# Patient Record
Sex: Male | Born: 1975 | Race: Black or African American | Hispanic: No | Marital: Single | Smoking: Former smoker
Health system: Southern US, Community
[De-identification: ages and names within clinical notes are randomized; demographics above are authoritative.]

## PROBLEM LIST (undated history)

## (undated) DIAGNOSIS — K5190 Ulcerative colitis, unspecified, without complications: Secondary | ICD-10-CM

## (undated) DIAGNOSIS — F89 Unspecified disorder of psychological development: Secondary | ICD-10-CM

## (undated) DIAGNOSIS — I10 Essential (primary) hypertension: Secondary | ICD-10-CM

## (undated) DIAGNOSIS — R569 Unspecified convulsions: Secondary | ICD-10-CM

## (undated) HISTORY — DX: Ulcerative colitis, unspecified, without complications: K51.90

## (undated) HISTORY — PX: TONSILLECTOMY: SUR1361

---

## 2006-12-11 ENCOUNTER — Emergency Department: Payer: Self-pay | Admitting: Internal Medicine

## 2013-07-05 ENCOUNTER — Emergency Department: Payer: Self-pay | Admitting: Emergency Medicine

## 2013-07-05 LAB — BASIC METABOLIC PANEL
Anion Gap: 1 — ABNORMAL LOW (ref 7–16)
BUN: 10 mg/dL (ref 7–18)
CO2: 32 mmol/L (ref 21–32)
Calcium, Total: 9.4 mg/dL (ref 8.5–10.1)
Chloride: 100 mmol/L (ref 98–107)
Creatinine: 1.05 mg/dL (ref 0.60–1.30)
EGFR (African American): >60
EGFR (Non-African Amer.): >60
Glucose: 94 mg/dL (ref 65–99)
OSMOLALITY: 265 (ref 275–301)
Potassium: 3.8 mmol/L (ref 3.5–5.1)
Sodium: 133 mmol/L — ABNORMAL LOW (ref 136–145)

## 2013-07-05 LAB — CBC
HCT: 41.6 % (ref 40.0–52.0)
HGB: 13.2 g/dL (ref 13.0–18.0)
MCH: 23.2 pg — AB (ref 26.0–34.0)
MCHC: 31.6 g/dL — ABNORMAL LOW (ref 32.0–36.0)
MCV: 73 fL — AB (ref 80–100)
Platelet: 190 10*3/uL (ref 150–440)
RBC: 5.67 10*6/uL (ref 4.40–5.90)
RDW: 13.4 % (ref 11.5–14.5)
WBC: 7.9 10*3/uL (ref 3.8–10.6)

## 2013-07-09 DIAGNOSIS — R55 Syncope and collapse: Secondary | ICD-10-CM

## 2013-07-09 HISTORY — DX: Syncope and collapse: R55

## 2015-05-26 ENCOUNTER — Other Ambulatory Visit: Payer: Self-pay | Admitting: Family Medicine

## 2015-05-26 ENCOUNTER — Ambulatory Visit
Admission: RE | Admit: 2015-05-26 | Discharge: 2015-05-26 | Disposition: A | Discharge status: Home / Self Care | Payer: Medicare Other | Source: Ambulatory Visit | Attending: Family Medicine | Admitting: Family Medicine

## 2015-05-26 DIAGNOSIS — M25531 Pain in right wrist: Secondary | ICD-10-CM

## 2016-03-14 ENCOUNTER — Encounter: Payer: Self-pay | Admitting: Emergency Medicine

## 2016-03-14 ENCOUNTER — Emergency Department: Payer: Medicare Other

## 2016-03-14 ENCOUNTER — Emergency Department
Admission: EM | Admit: 2016-03-14 | Discharge: 2016-03-14 | Disposition: A | Discharge status: Home / Self Care | Payer: Medicare Other | Attending: Emergency Medicine | Admitting: Emergency Medicine

## 2016-03-14 DIAGNOSIS — Y999 Unspecified external cause status: Secondary | ICD-10-CM | POA: Insufficient documentation

## 2016-03-14 DIAGNOSIS — I10 Essential (primary) hypertension: Secondary | ICD-10-CM | POA: Insufficient documentation

## 2016-03-14 DIAGNOSIS — Z79899 Other long term (current) drug therapy: Secondary | ICD-10-CM | POA: Insufficient documentation

## 2016-03-14 DIAGNOSIS — S39012A Strain of muscle, fascia and tendon of lower back, initial encounter: Secondary | ICD-10-CM | POA: Diagnosis not present

## 2016-03-14 DIAGNOSIS — Y929 Unspecified place or not applicable: Secondary | ICD-10-CM | POA: Diagnosis not present

## 2016-03-14 DIAGNOSIS — S3992XA Unspecified injury of lower back, initial encounter: Secondary | ICD-10-CM | POA: Diagnosis present

## 2016-03-14 DIAGNOSIS — X58XXXA Exposure to other specified factors, initial encounter: Secondary | ICD-10-CM | POA: Insufficient documentation

## 2016-03-14 DIAGNOSIS — Y939 Activity, unspecified: Secondary | ICD-10-CM | POA: Insufficient documentation

## 2016-03-14 HISTORY — DX: Essential (primary) hypertension: I10

## 2016-03-14 MED ORDER — NAPROXEN 500 MG PO TABS: 500.0000 mg | ORAL_TABLET | Freq: Two times a day (BID) | ORAL | Status: DC

## 2016-03-14 NOTE — ED Provider Notes (Signed)
[facility] Emergency Department Provider Note   ____________________________________________   First MD Initiated Contact with Patient 03/14/16 [phone]     (approximate)  I have reviewed the triage vital signs and the nursing notes.   HISTORY  Chief Complaint Back Pain    HPI Cameron Payne is a 40 y.o. male patient complained of low back pain for 2-3 weeks. Patient denies any provocative incident for his pain. Patient has mild mental retardation and his mother is helping with the history. Patient denies any radicular component to this pain. Patient denies any bladder or bowel dysfunction. Mother states she is also concerned about the patient's loss of weight. Mother has discussed this complaint with family doctor and stated all the blood work was normal. Mother stated there is decreased appetite. Mother and patient denies vomiting or diarrhea.Patient rates his pain as a 5/10. Patient describes pain as "achy". No palliative measures for this complaint.   Past Medical History:  Diagnosis Date  . Hypertension     There are no active problems to display for this patient.   History reviewed. No pertinent surgical history.  Prior to Admission medications   Medication Sig Start Date End Date Taking? Authorizing Provider  hydrochlorothiazide (MICROZIDE) 12.5 MG capsule Take 12.5 mg by mouth daily.   Yes Historical Provider, MD  naproxen (NAPROSYN) 500 MG tablet Take 1 tablet (500 mg total) by mouth 2 (two) times daily with a meal. 03/14/16   Sable Feil, PA-C    Allergies Review of patient's allergies indicates no known allergies.  No family history on file.  Social History Social History  Substance Use Topics  . Smoking status: Never Smoker  . Smokeless tobacco: Never Used  . Alcohol use No    Review of Systems Constitutional: No fever/chills Eyes: No visual changes. ENT: No sore throat. Cardiovascular: Denies chest pain. Respiratory:  Denies shortness of breath. Gastrointestinal: No abdominal pain.  No nausea, no vomiting.  No diarrhea.  No constipation. Genitourinary: Negative for dysuria. Musculoskeletal: Positive for back pain. Skin: Negative for rash. Neurological: Negative for headaches, focal weakness or numbness. Endocrine:Hypertension Hematological/Lymphatic: Allergic/Immunilogical: **} 10-point ROS otherwise negative.  ____________________________________________   PHYSICAL EXAM:  VITAL SIGNS: ED Triage Vitals [03/14/16 0923]  Enc Vitals Group     BP 129/75     Pulse Rate 73     Resp      Temp 98 F (36.7 C)     Temp Source Oral     SpO2 97 %     Weight      Height      Head Circumference      Peak Flow      Pain Score 5     Pain Loc      Pain Edu?      Excl. in [place]?     Constitutional: Alert and oriented. Well appearing and in no acute distress. Eyes: Conjunctivae are normal. PERRL. EOMI. Head: Atraumatic. Nose: No congestion/rhinnorhea. Mouth/Throat: Mucous membranes are moist.  Oropharynx non-erythematous. Neck: No stridor.  No cervical spine tenderness to palpation. Hematological/Lymphatic/Immunilogical: No cervical lymphadenopathy. Cardiovascular: Normal rate, regular rhythm. Grossly normal heart sounds.  Good peripheral circulation. Respiratory: Normal respiratory effort.  No retractions. Lungs CTAB. Gastrointestinal: Soft and nontender. No distention. No abdominal bruits. No CVA tenderness. Musculoskeletal: No lower extremity tenderness nor edema.  No joint effusions.No obvious deformity of the lumbar spine. Patient has some mild guarding palpation of L3-S1. Patient negative straight leg test. Patient has  negative gait. Patient decreased range of motion with flexion.  Neurologic:  Normal speech and language. No gross focal neurologic deficits are appreciated. No gait instability. Skin:  Skin is warm, dry and intact. No rash noted. Psychiatric: Mood and affect are normal. Speech and  behavior are normal.  ____________________________________________   LABS (all labs ordered are listed, but only abnormal results are displayed)  Labs Reviewed - No data to display ____________________________________________  EKG   ____________________________________________  RADIOLOGY  No acute findings x-ray of the lumbar spine. ____________________________________________   PROCEDURES  Procedure(s) performed: None  Procedures  Critical Care performed: No  ____________________________________________   INITIAL IMPRESSION / ASSESSMENT AND PLAN / ED COURSE  Pertinent labs & imaging results that were available during my care of the patient were reviewed by me and considered in my medical decision making (see chart for details).  Low back pain. Discussed x-ray finding with patient. Patient given discharge Instructions. Patient in a prescription for naproxen and advised follow-up with family doctor condition persists.  Clinical Course     ____________________________________________   FINAL CLINICAL IMPRESSION(S) / ED DIAGNOSES  Final diagnoses:  Strain of lumbar region, initial encounter      NEW MEDICATIONS STARTED DURING THIS VISIT:  New Prescriptions   NAPROXEN (NAPROSYN) 500 MG TABLET    Take 1 tablet (500 mg total) by mouth 2 (two) times daily with a meal.     Note:  This document was prepared using Dragon voice recognition software and may include unintentional dictation errors.    Sable Feil, PA-C 03/14/16 [place], MD 03/14/16 [phone]

## 2016-03-14 NOTE — ED Triage Notes (Signed)
Patient presents to the ED with lower back pain.  Patient has some mental delays and family reports being worried patient has more than just back pain but is only able to verbalize back pain.  Patient ambulatory to triage with no distress at this time.  Family denies nausea and vomiting.

## 2016-03-14 NOTE — ED Notes (Signed)
See triage note   Having lower back pain which is non radiating for couple of weeks   Unsure of injury denies any n/v/ fever just having back pain  Ambulates well to treatment area

## 2017-01-16 ENCOUNTER — Emergency Department: Payer: Medicare Other

## 2017-01-16 ENCOUNTER — Encounter: Payer: Self-pay | Admitting: Emergency Medicine

## 2017-01-16 ENCOUNTER — Emergency Department
Admission: EM | Admit: 2017-01-16 | Discharge: 2017-01-16 | Disposition: A | Discharge status: Home / Self Care | Payer: Medicare Other | Attending: Emergency Medicine | Admitting: Emergency Medicine

## 2017-01-16 DIAGNOSIS — R55 Syncope and collapse: Secondary | ICD-10-CM | POA: Diagnosis present

## 2017-01-16 DIAGNOSIS — I10 Essential (primary) hypertension: Secondary | ICD-10-CM | POA: Insufficient documentation

## 2017-01-16 HISTORY — DX: Unspecified convulsions: R56.9

## 2017-01-16 LAB — COMPREHENSIVE METABOLIC PANEL
ALT: 13 U/L — ABNORMAL LOW (ref 17–63)
ANION GAP: 8 (ref 5–15)
AST: 24 U/L (ref 15–41)
Albumin: 4.4 g/dL (ref 3.5–5.0)
Alkaline Phosphatase: 41 U/L (ref 38–126)
BILIRUBIN TOTAL: 0.5 mg/dL (ref 0.3–1.2)
BUN: 14 mg/dL (ref 6–20)
CO2: 29 mmol/L (ref 22–32)
Calcium: 9.6 mg/dL (ref 8.9–10.3)
Chloride: 103 mmol/L (ref 101–111)
Creatinine, Ser: 0.92 mg/dL (ref 0.61–1.24)
GFR calc Af Amer: >60 mL/min (ref >60)
Glucose, Bld: 97 mg/dL (ref 65–99)
POTASSIUM: 3.6 mmol/L (ref 3.5–5.1)
Sodium: 140 mmol/L (ref 135–145)
TOTAL PROTEIN: 7.7 g/dL (ref 6.5–8.1)

## 2017-01-16 LAB — CBC
HCT: 41 % (ref 40.0–52.0)
Hemoglobin: 13.2 g/dL (ref 13.0–18.0)
MCH: 22.8 pg — ABNORMAL LOW (ref 26.0–34.0)
MCHC: 32.1 g/dL (ref 32.0–36.0)
MCV: 71.1 fL — AB (ref 80.0–100.0)
PLATELETS: 197 10*3/uL (ref 150–440)
RBC: 5.76 MIL/uL (ref 4.40–5.90)
RDW: 13.6 % (ref 11.5–14.5)
WBC: 6.8 10*3/uL (ref 3.8–10.6)

## 2017-01-16 LAB — URINALYSIS, COMPLETE (UACMP) WITH MICROSCOPIC
BILIRUBIN URINE: NEGATIVE
Bacteria, UA: NONE SEEN
GLUCOSE, UA: NEGATIVE mg/dL
Hgb urine dipstick: NEGATIVE
KETONES UR: NEGATIVE mg/dL
LEUKOCYTES UA: NEGATIVE
Nitrite: NEGATIVE
PH: 7 (ref 5.0–8.0)
Protein, ur: NEGATIVE mg/dL
RBC / HPF: NONE SEEN RBC/hpf (ref 0–5)
Specific Gravity, Urine: 1.01 (ref 1.005–1.030)

## 2017-01-16 LAB — TROPONIN I

## 2017-01-16 MED ORDER — SODIUM CHLORIDE 0.9 % IV SOLN
Freq: Once | INTRAVENOUS | Status: AC
  Administered 2017-01-16: 11:00:00 via INTRAVENOUS

## 2017-01-16 NOTE — ED Provider Notes (Signed)
[facility] Emergency Department Provider Note       Time seen: ----------------------------------------- 10:30 AM on 01/16/2017 -----------------------------------------     I have reviewed the triage vital signs and the nursing notes.   HISTORY   Chief Complaint Loss of Consciousness    HPI Cameron Payne is a 41 y.o. male who presents to the ED for syncope. Patient reports he passed out twice yesterday. The second one was witnessed by the sister states he had a brief period of confusion for about 5 minutes afterwards. He arrives alert and oriented. 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 Patient states when he passed out today he was standing up urinating. He subsequently woke up on the bathroom floor. He denies any complaints at this time.   Past Medical History:  Diagnosis Date  . Hypertension   . Seizures ([place])    as a child    There are no active problems to display for this patient.   History reviewed. No pertinent surgical history.  Allergies Patient has no known allergies.  Social History Social History  Substance Use Topics  . Smoking status: Never Smoker  . Smokeless tobacco: Never Used  . Alcohol use No    Review of Systems Constitutional: Negative for fever. Eyes: Negative for vision changes ENT:  Negative for congestion, sore throat Cardiovascular: Negative for chest pain. Respiratory: Negative for shortness of breath. Gastrointestinal: Negative for abdominal pain, vomiting and diarrhea. Genitourinary: Negative for dysuria. Musculoskeletal: Negative for back pain. Skin: Negative for rash. Neurological: Negative for headaches, focal weakness or numbness.  All systems negative/normal/unremarkable except as stated in the HPI  ____________________________________________   PHYSICAL EXAM:  VITAL SIGNS: ED Triage Vitals [01/16/17 0859]  Enc Vitals Group     BP 128/85     Pulse  Rate 83     Resp 20     Temp 99.7 F (37.6 C)     Temp Source Oral     SpO2 100 %     Weight 146 lb 2.6 oz (66.3 kg)     Height 5' 11"  (1.803 m)     Head Circumference      Peak Flow      Pain Score      Pain Loc      Pain Edu?      Excl. in [place]?     Constitutional: Alert and oriented. Well appearing and in no distress. Eyes: Conjunctivae are normal. Normal extraocular movements. ENT   Head: Normocephalic and atraumatic.   Nose: No congestion/rhinnorhea.   Mouth/Throat: Mucous membranes are moist.   Neck: No stridor. Cardiovascular: Normal rate, regular rhythm. No murmurs, rubs, or gallops. Respiratory: Normal respiratory effort without tachypnea nor retractions. Breath sounds are clear and equal bilaterally. No wheezes/rales/rhonchi. Gastrointestinal: Soft and nontender. Normal bowel sounds Musculoskeletal: Nontender with normal range of motion in extremities. No lower extremity tenderness nor edema. Neurologic:  Normal speech and language. No gross focal neurologic deficits are appreciated.  Skin:  Skin is warm, dry and intact. No rash noted. Psychiatric: Mood and affect are normal. Speech and behavior are normal.  ____________________________________________  EKG: Interpreted by me. Sinus rhythm rate of 77 bpm, normal PR interval, normal QRS width, normal QT, normal axis.  ____________________________________________  ED COURSE:  Pertinent labs & imaging results that were available during my care of the patient were reviewed by me and considered in my medical decision making (see chart  for details). Patient presents for syncope, we will assess with labs and imaging as indicated.   Procedures ____________________________________________   LABS (pertinent positives/negatives)  Labs Reviewed  CBC - Abnormal; Notable for the following:       Result Value   MCV 71.1 (*)    MCH 22.8 (*)    All other components within normal limits  URINALYSIS, COMPLETE  (UACMP) WITH MICROSCOPIC - Abnormal; Notable for the following:    Color, Urine YELLOW (*)    APPearance CLEAR (*)    Squamous Epithelial / LPF 0-5 (*)    All other components within normal limits  COMPREHENSIVE METABOLIC PANEL - Abnormal; Notable for the following:    ALT 13 (*)    All other components within normal limits  TROPONIN I    RADIOLOGY Images were viewed by me  CT head  IMPRESSION: 1. 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 2. No acute intracranial findings or skull fracture. ____________________________________________  FINAL ASSESSMENT AND PLAN  Syncope  Plan: Patient's labs and imaging were dictated above. Patient had presented for Syncope of uncertain etiology. We will advise stopping his blood pressure medication and following up with cardiology for reevaluation. He is stable at this time.   Earleen Newport, MD   Note: This note was generated in part or whole with voice recognition software. Voice recognition is usually quite accurate but there are transcription errors that can and very often do occur. I apologize for any typographical errors that were not detected and corrected.     Earleen Newport, MD 01/16/17 [phone]

## 2017-01-16 NOTE — ED Triage Notes (Signed)
FIRST NURSE NOTE -pt c/o syncope X 2 today. Ambulatory to check in desk without difficulty. NAD noted at this time

## 2017-01-16 NOTE — ED Triage Notes (Signed)
States "passed out" x 2 yesterday. Second witnessed by sister who states he had period of confusion for about 5 min after. Alert and oriented now. History of seizures as child but not since 41 year old.

## 2017-10-23 IMAGING — CT CT HEAD W/O CM
3 series · 14 of 46 positions shown, 16 images · non-contrast
Comparison: None.

CLINICAL DATA: Syncopal episodes yesterday.

EXAM:
CT HEAD WITHOUT CONTRAST
TECHNIQUE: Contiguous axial images were obtained from the base of the skull
through the vertex without intravenous contrast.

[Series 2: head wo · axial · 0.47mm/px · z∈[-145,-25]mm · 8 of 29 slices shown, 10 images]
[im 3/29  brain]
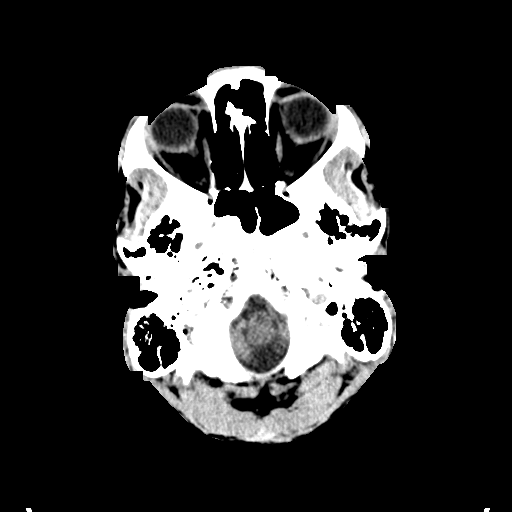
[im 3/29  bone]
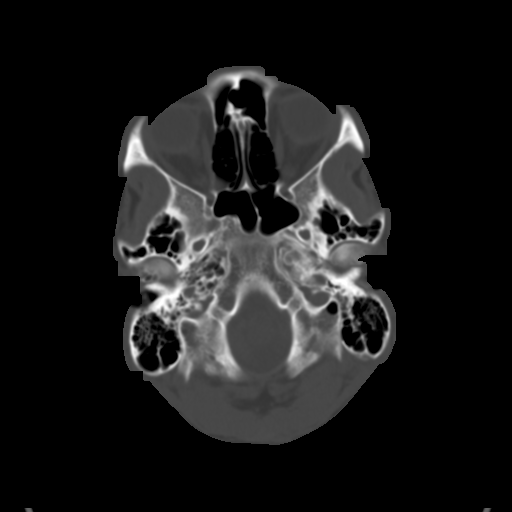
[im 7/29  brain]
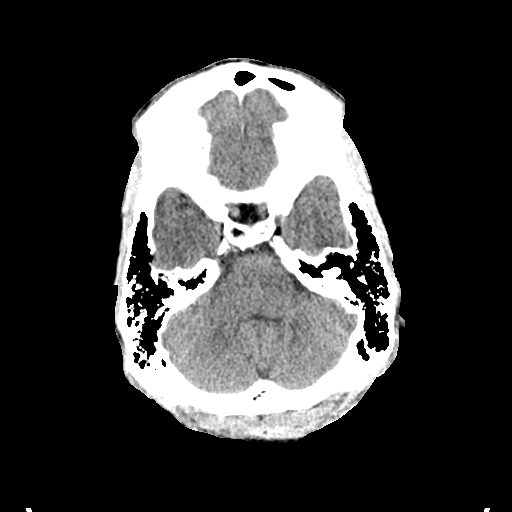
[im 10/29  brain]
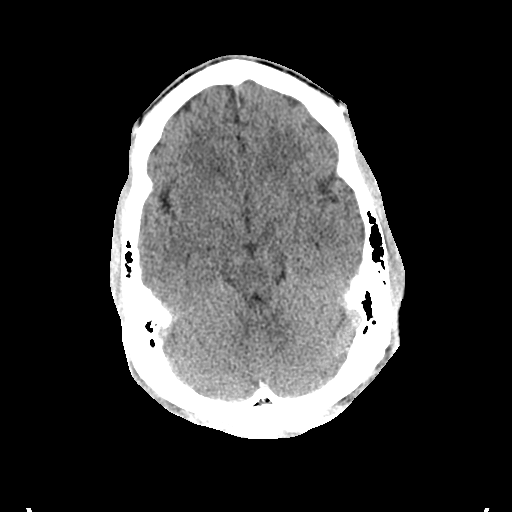
[im 13/29  brain]
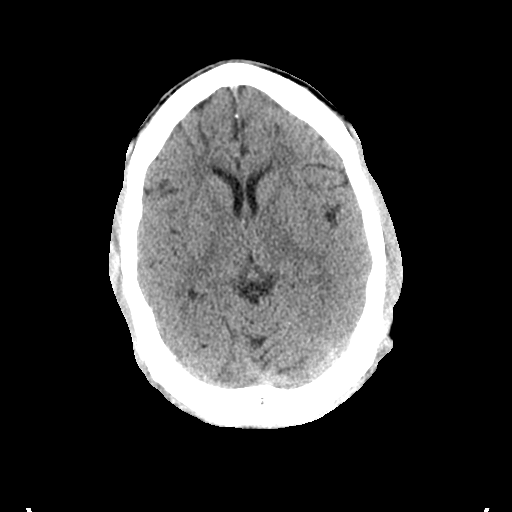
[im 17/29  brain]
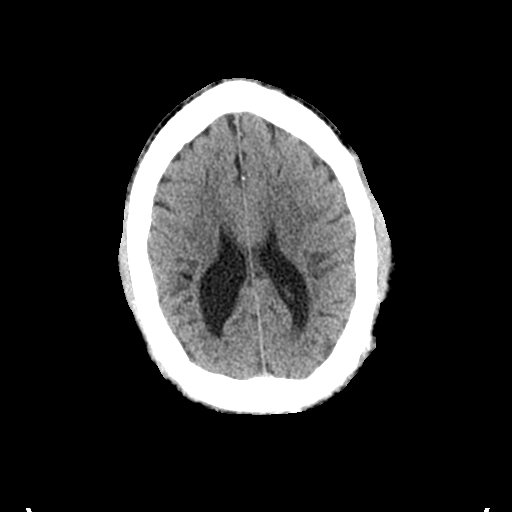
[im 17/29  bone]
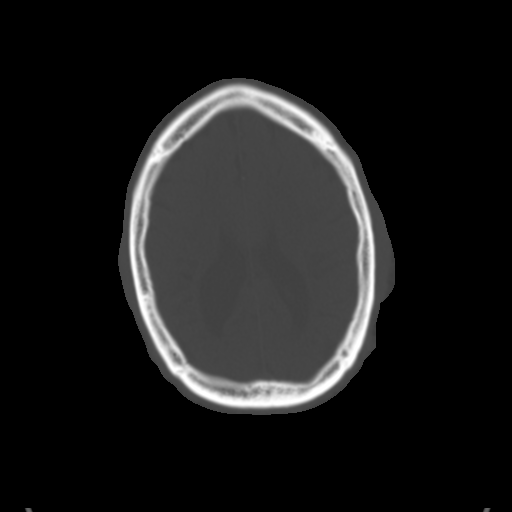
[im 20/29  brain]
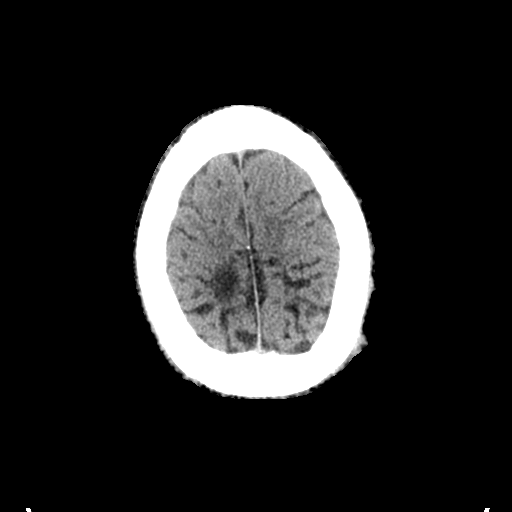
[im 23/29  brain]
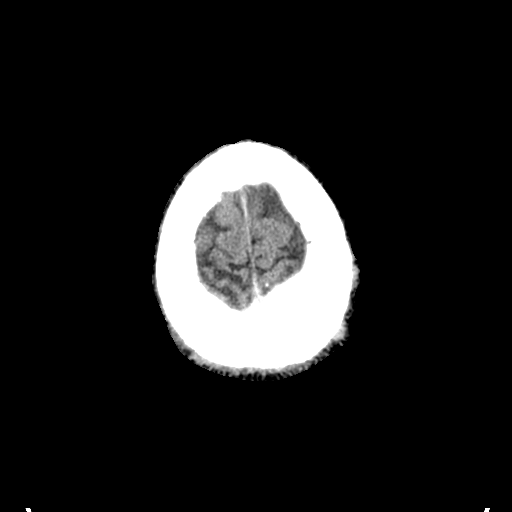
[im 27/29  brain]
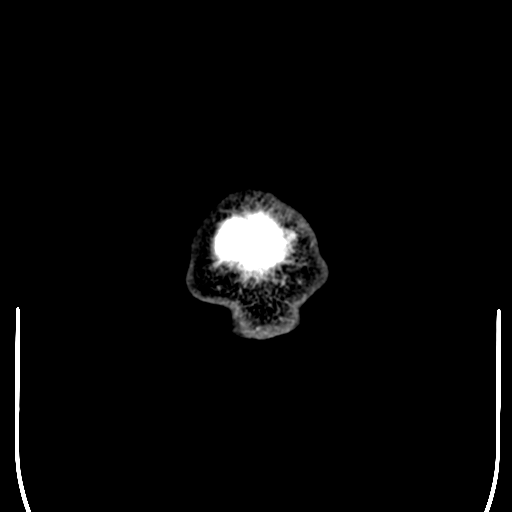

[Series 4: coronal soft tissue · coronal · 0.28mm/px · 3 of 58 slices shown]
[im 20/58  brain]
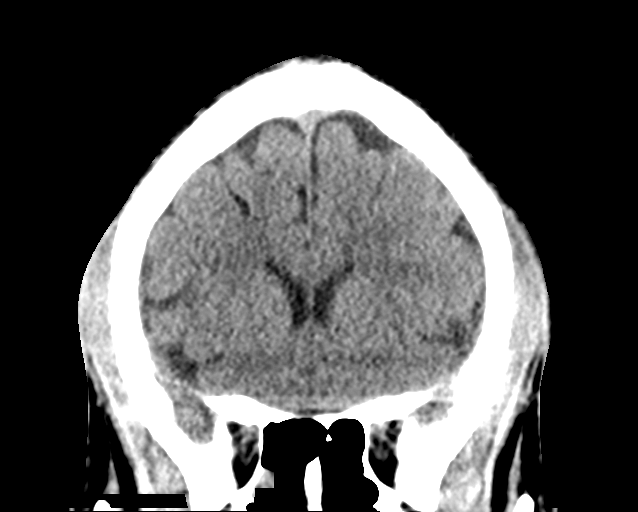
[im 26/58  brain]
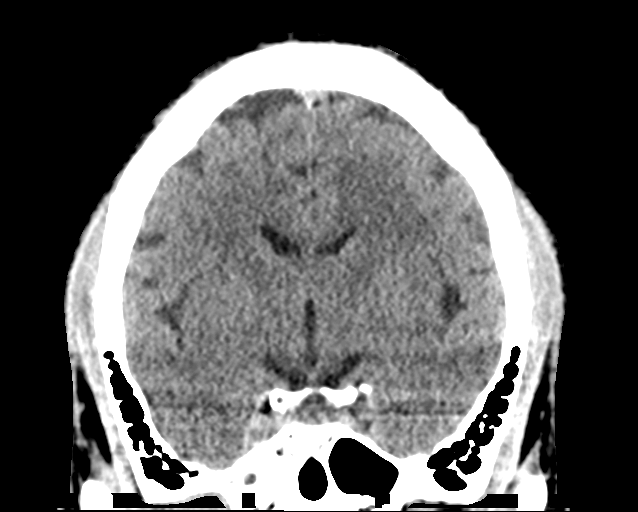
[im 32/58  brain]
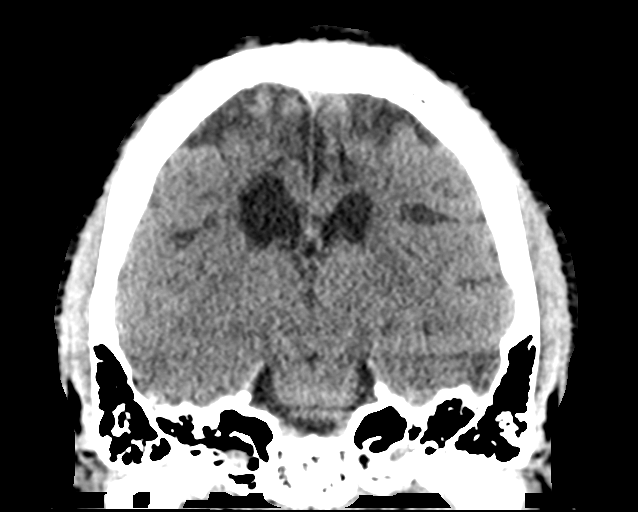

[Series 5: sagittal soft tissue · sagittal · 0.29mm/px · 3 of 51 slices shown]
[im 17/51  brain]
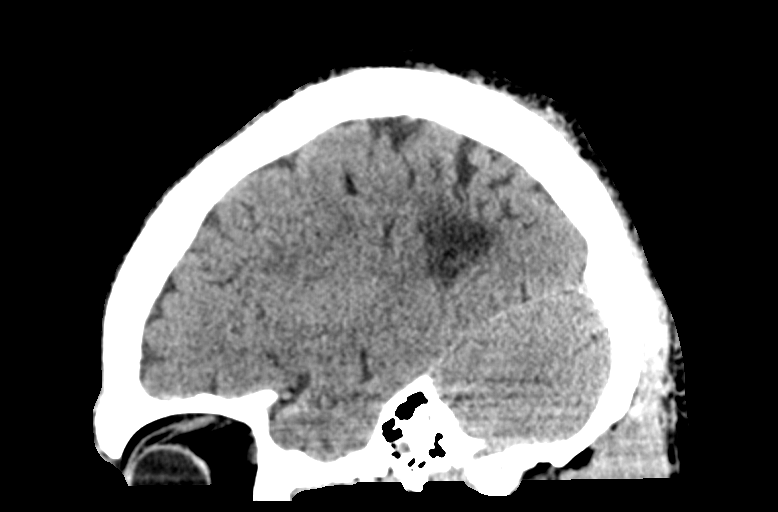
[im 26/51  brain]
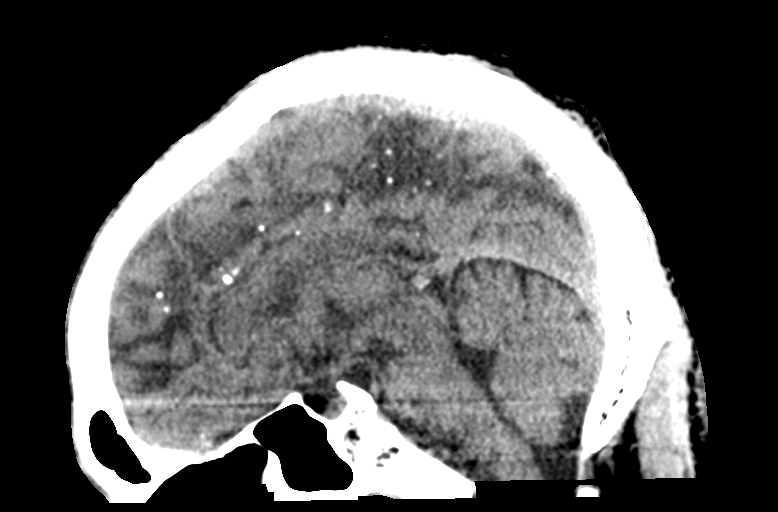
[im 34/51  brain]
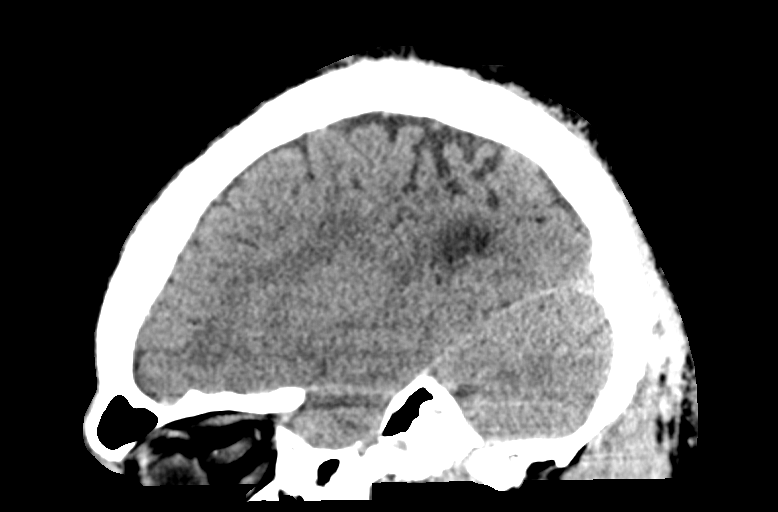

[14 of 46 positions shown; findings below may reference images not displayed]

FINDINGS: Brain: Dilatation of the lateral ventricles, right greater than left
at the parieto-occipital junction region there is loss of white
matter and cortical atrophy. Tiny calcifications are also noted.
This is most likely birth related or perinatal injury or infection.

No acute intracranial findings. No extra-axial fluid collections. No
findings for infarction or hemorrhage. The brainstem and cerebellum
are grossly normal.

Vascular: No hyperdense vessel or unexpected calcification.

Skull: No skull fracture or bone lesions.

Sinuses/Orbits: The paranasal sinuses and mastoid air cells are
clear except for a small amount of fluid or debris in a right
posterior ethmoid air cell. Prominently aerated mastoid air cells,
temporal bones and sphenoid wings. The globes are intact.

Other: No scalp lesions or hematoma.
IMPRESSION: 1. Evidence of remote (likely perinatal) brain injury.
2. No acute intracranial findings or skull fracture.

## 2017-10-24 ENCOUNTER — Emergency Department
Admission: EM | Admit: 2017-10-24 | Discharge: 2017-10-24 | Disposition: A | Discharge status: Home / Self Care | Payer: Medicare Other | Attending: Emergency Medicine | Admitting: Emergency Medicine

## 2017-10-24 ENCOUNTER — Encounter: Payer: Self-pay | Admitting: Emergency Medicine

## 2017-10-24 DIAGNOSIS — F79 Unspecified intellectual disabilities: Secondary | ICD-10-CM | POA: Diagnosis not present

## 2017-10-24 DIAGNOSIS — I10 Essential (primary) hypertension: Secondary | ICD-10-CM | POA: Diagnosis not present

## 2017-10-24 DIAGNOSIS — R197 Diarrhea, unspecified: Secondary | ICD-10-CM

## 2017-10-24 DIAGNOSIS — K921 Melena: Secondary | ICD-10-CM | POA: Diagnosis not present

## 2017-10-24 DIAGNOSIS — Z79899 Other long term (current) drug therapy: Secondary | ICD-10-CM | POA: Diagnosis not present

## 2017-10-24 HISTORY — DX: Unspecified disorder of psychological development: F89

## 2017-10-24 LAB — LIPASE, BLOOD: Lipase: 43 U/L (ref 11–51)

## 2017-10-24 LAB — URINALYSIS, COMPLETE (UACMP) WITH MICROSCOPIC
BACTERIA UA: NONE SEEN
Bilirubin Urine: NEGATIVE
GLUCOSE, UA: NEGATIVE mg/dL
HGB URINE DIPSTICK: NEGATIVE
Ketones, ur: NEGATIVE mg/dL
Leukocytes, UA: NEGATIVE
NITRITE: NEGATIVE
Protein, ur: NEGATIVE mg/dL
SPECIFIC GRAVITY, URINE: 1.025 (ref 1.005–1.030)
Squamous Epithelial / LPF: NONE SEEN (ref 0–5)
pH: 6 (ref 5.0–8.0)

## 2017-10-24 LAB — COMPREHENSIVE METABOLIC PANEL
ALK PHOS: 47 U/L (ref 38–126)
ALT: 8 U/L — ABNORMAL LOW (ref 17–63)
ANION GAP: 7 (ref 5–15)
AST: 18 U/L (ref 15–41)
Albumin: 4.3 g/dL (ref 3.5–5.0)
BILIRUBIN TOTAL: 0.5 mg/dL (ref 0.3–1.2)
BUN: 17 mg/dL (ref 6–20)
CALCIUM: 9.4 mg/dL (ref 8.9–10.3)
CO2: 28 mmol/L (ref 22–32)
Chloride: 109 mmol/L (ref 101–111)
Creatinine, Ser: 0.77 mg/dL (ref 0.61–1.24)
GFR calc Af Amer: >60 mL/min (ref >60)
GLUCOSE: 70 mg/dL (ref 65–99)
Potassium: 4 mmol/L (ref 3.5–5.1)
Sodium: 144 mmol/L (ref 135–145)
TOTAL PROTEIN: 7.5 g/dL (ref 6.5–8.1)

## 2017-10-24 LAB — CBC
HCT: 38.4 % — ABNORMAL LOW (ref 40.0–52.0)
HEMOGLOBIN: 12.2 g/dL — AB (ref 13.0–18.0)
MCH: 23.2 pg — ABNORMAL LOW (ref 26.0–34.0)
MCHC: 31.8 g/dL — AB (ref 32.0–36.0)
MCV: 72.8 fL — ABNORMAL LOW (ref 80.0–100.0)
Platelets: 214 10*3/uL (ref 150–440)
RBC: 5.28 MIL/uL (ref 4.40–5.90)
RDW: 14 % (ref 11.5–14.5)
WBC: 6 10*3/uL (ref 3.8–10.6)

## 2017-10-24 NOTE — Discharge Instructions (Addendum)
Your workup in the Emergency Department today was reassuring.  We did not find any specific abnormalities.  We recommend you drink plenty of fluids, take your regular medications and/or any new ones prescribed today, and follow up with the doctor(s) listed in these documents as recommended.  Return to the Emergency Department if you develop new or worsening symptoms that concern you.  

## 2017-10-24 NOTE — ED Triage Notes (Signed)
Pt arrived via POV with mother, reports diarrhea for about 1 week, bright red blood present when wiping. Pt denies any black stools.  Pt denies abdominal pain, pt states he has not seen blood in toilet.  Mother states he has told her there was blood only a few times.

## 2017-10-24 NOTE — ED Notes (Signed)
Pt unable to provide urine specimen at this time

## 2017-10-24 NOTE — ED Provider Notes (Signed)
[facility] Emergency Department Provider Note  ____________________________________________   First MD Initiated Contact with Patient 10/24/17 1738     (approximate)  I have reviewed the triage vital signs and the nursing notes.   HISTORY  Chief Complaint Diarrhea  The patient has a developmental disability and his mother is his legal guardian and presents with him at bedside and provides some of the history.  HPI Cameron Payne is a 42 y.o. male who presents for evaluation of bright red blood per rectum and some possible diarrhea.  The patient is unable to provide an extensive history but reports that he has had some soft stools for about a week and that when he wipes there is bright red blood on the toilet paper.  He has not had any black or tarry stools.  He denies nausea, vomiting, abdominal pain or cramping, fever/chills, chest pain, and shortness of breath.  Nothing particular makes his symptoms better or worse.  He is only observe the blood a few times but his mother felt like he should be evaluated.  He is not able to provide any additional history but is in no acute distress at this time.  States that the symptoms are mild.  Past Medical History:  Diagnosis Date  . Developmental disability   . Hypertension   . Seizures ([place])    as a child    There are no active problems to display for this patient.   History reviewed. No pertinent surgical history.  Prior to Admission medications   Medication Sig Start Date End Date Taking? Authorizing Provider  fexofenadine (ALLEGRA) 180 MG tablet Take 180 mg by mouth daily.    [provider]  hydrochlorothiazide (MICROZIDE) 12.5 MG capsule Take 12.5 mg by mouth daily.    [provider]  naproxen (NAPROSYN) 500 MG tablet Take 1 tablet (500 mg total) by mouth 2 (two) times daily with a meal. Patient not taking: Reported on 01/16/2017 03/14/16   Sable Feil, PA-C     Allergies Patient has no known allergies.  History reviewed. No pertinent family history.  Social History Social History   Tobacco Use  . Smoking status: Never Smoker  . Smokeless tobacco: Never Used  Substance Use Topics  . Alcohol use: No  . Drug use: Not on file    Review of Systems The patient has a developmental disability and his mother is his legal guardian and presents with him at bedside and provides some of the history.  Constitutional: No fever/chills Eyes: No visual changes. ENT: No sore throat. Cardiovascular: Denies chest pain. Respiratory: Denies shortness of breath. Gastrointestinal: No abdominal pain.  No nausea, no vomiting.  Increased frequency of bowel movements.  Painless bright red blood per rectum.  Genitourinary: Negative for dysuria. Musculoskeletal: Negative for neck pain.  Negative for back pain. Integumentary: Negative for rash. Neurological: Negative for headaches, focal weakness or numbness.   ____________________________________________   PHYSICAL EXAM:  VITAL SIGNS: ED Triage Vitals  Enc Vitals Group     BP 10/24/17 1434 129/84     Pulse Rate 10/24/17 1434 86     Resp 10/24/17 1434 18     Temp 10/24/17 1434 98 F (36.7 C)     Temp Source 10/24/17 1434 Oral     SpO2 10/24/17 1434 99 %     Weight --      Height --      Head Circumference --      Peak Flow --  Pain Score 10/24/17 1435 0     Pain Loc --      Pain Edu? --      Excl. in [place]? --     Constitutional: Alert and oriented. Well appearing and in no acute distress. Eyes: Conjunctivae are normal.  Head: Atraumatic. Nose: No congestion/rhinnorhea. Mouth/Throat: Mucous membranes are moist. Neck: No stridor.  No meningeal signs.   Cardiovascular: Normal rate, regular rhythm. Good peripheral circulation. Grossly normal heart sounds. Respiratory: Normal respiratory effort.  No retractions. Lungs CTAB. Gastrointestinal: Soft and nontender. No distention.  Rectal:  Nontender, no external abnormalities including hemorrhoids.  No significant amount of stool in the rectal vault, but there is a minimal amount of hematochezia, strongly positive on guaiac card Musculoskeletal: No lower extremity tenderness nor edema. No gross deformities of extremities. Neurologic:  Normal speech and language. No gross focal neurologic deficits are appreciated.  Skin:  Skin is warm, dry and intact. No rash noted. Psychiatric: Mood and affect are normal. Speech and behavior are normal.  ____________________________________________   LABS (all labs ordered are listed, but only abnormal results are displayed)  Labs Reviewed  COMPREHENSIVE METABOLIC PANEL - Abnormal; Notable for the following components:      Result Value   ALT 8 (*)    All other components within normal limits  CBC - Abnormal; Notable for the following components:   Hemoglobin 12.2 (*)    HCT 38.4 (*)    MCV 72.8 (*)    MCH 23.2 (*)    MCHC 31.8 (*)    All other components within normal limits  URINALYSIS, COMPLETE (UACMP) WITH MICROSCOPIC - Abnormal; Notable for the following components:   Color, Urine YELLOW (*)    APPearance CLEAR (*)    All other components within normal limits  LIPASE, BLOOD   ____________________________________________  EKG  None - EKG not ordered by ED physician ____________________________________________  RADIOLOGY   ED MD interpretation: No indication for imaging  Official radiology report(s): No results found.  ____________________________________________   PROCEDURES  Critical Care performed: No   Procedure(s) performed:   Procedures   ____________________________________________   INITIAL IMPRESSION / ASSESSMENT AND PLAN / ED COURSE  As part of my medical decision making, I reviewed the following data within the [place] History obtained from family, Nursing notes reviewed and incorporated, Labs reviewed  and Notes from prior  ED visits    Differential diagnosis includes, but is not limited to, internal hemorrhoid, diverticular disease, AV malformation, upper GI bleeding.  However the patient does have some bright red blood and he is in [place] no distress with no tenderness to palpation of the abdomen and no other symptoms.  Labs are all unremarkable including a stable hemoglobin.  Vital signs are normal and stable.  I advised him to follow-up with his primary care physician but I am also providing information so that he can follow-up with Dr. Vicente Males with gastroenterology and I sent a message through [facility] to Dr. Georgeann [facility].  His mom understands and agrees with the plan.  I gave my usual and customary return precautions.      ____________________________________________  FINAL CLINICAL IMPRESSION(S) / ED DIAGNOSES  Final diagnoses:  Hematochezia  Diarrhea, unspecified type     MEDICATIONS GIVEN DURING THIS VISIT:  Medications - No data to display   ED Discharge Orders    None       Note:  This document was prepared using Dragon voice recognition software and may include  unintentional dictation errors.    Hinda Kehr, MD 10/24/17 [phone]

## 2017-11-01 ENCOUNTER — Other Ambulatory Visit: Payer: Self-pay

## 2017-11-01 ENCOUNTER — Encounter: Payer: Self-pay | Admitting: Gastroenterology

## 2017-11-01 ENCOUNTER — Ambulatory Visit (INDEPENDENT_AMBULATORY_CARE_PROVIDER_SITE_OTHER): Payer: Medicare Other | Admitting: Gastroenterology

## 2017-11-01 VITALS — BP 124/76 | HR 73 | Temp 97.9°F | Ht 71.0 in | Wt 138.8 lb

## 2017-11-01 DIAGNOSIS — K625 Hemorrhage of anus and rectum: Secondary | ICD-10-CM

## 2017-11-01 DIAGNOSIS — D509 Iron deficiency anemia, unspecified: Secondary | ICD-10-CM | POA: Diagnosis not present

## 2017-11-01 MED ORDER — PEG 3350-KCL-NA BICARB-NACL 420 G PO SOLR: 4000.0000 mL | Freq: Once | ORAL | 0 refills | Status: AC

## 2017-11-01 MED ORDER — BISACODYL 5 MG PO TBEC: 10.0000 mg | DELAYED_RELEASE_TABLET | Freq: Once | ORAL | 0 refills | Status: AC

## 2017-11-01 NOTE — Progress Notes (Signed)
Cameron Payne, [place] [address]  Main: [phone]  Fax: [phone]   Gastroenterology Consultation  Referring Provider:     Donnie Coffin, MD Primary Care Physician:  Cameron Barman, MD Primary Gastroenterologist:  Dr. Vonda Payne Reason for Consultation:     Bright red blood per rectum        HPI:    Chief Complaint  Patient presents with  . Establish Care    Dr. Clide Payne ref. for hematochezia, diarrhea. blood noted when wipes x1 wk, also was having diarrhea at the time. no hemorroids.    Cameron Payne is a 42 y.o. y/o male referred for consultation & management  by Dr. Loma Payne, Cameron Loh, MD.  Patient went to the ER on Oct 24, 2017 due to bright red blood per rectum.  History is provided by patient and his mother at bedside.  Symptoms were ongoing for 1 to 1-1/2 weeks.  Patient reported 2-3 loose stools daily, with blood streaks in stool during that time.  Denies any abdominal pain, nausea vomiting, loss of appetite during that time.  Symptoms have now resolved.  Patient now reports formed bowel movements as of the last 2 to 3 days.  No fever or chills.  No recent travel.  No previous history of loose stools chronically.  No previous colonoscopy or EGD.  Lab work on the ER visit showed hemoglobin of 12.2, MCV of 72.  Patient was hemodynamically stable and discharged from the ER.  Past Medical History:  Diagnosis Date  . Developmental disability   . Hypertension   . Seizures ([place])    as a child    History reviewed. No pertinent surgical history.  Prior to Admission medications   Medication Sig Start Date End Date Taking? Authorizing Provider  fexofenadine (ALLEGRA) 180 MG tablet Take 180 mg by mouth daily.    [provider]  hydrochlorothiazide (MICROZIDE) 12.5 MG capsule Take 12.5 mg by mouth daily.    [provider]  naproxen (NAPROSYN) 500 MG tablet Take 1 tablet (500 mg total) by mouth 2 (two)  times daily with a meal. Patient not taking: Reported on 01/16/2017 03/14/16   Sable Feil, PA-C    Family History  Problem Relation Age of Onset  . Diabetes Mother   . Heart failure Mother   . Cancer Father        liver  . Cancer Paternal Grandfather        does not know what kind  . Cancer Maternal Aunt        breast (half sister) pt mother was adopted  . Colon cancer Neg Hx   . Thyroid disease Neg Hx      Social History   Tobacco Use  . Smoking status: Light Tobacco Smoker    Types: Cigars  . Smokeless tobacco: Never Used  . Tobacco comment: occ.   Substance Use Topics  . Alcohol use: Yes    Alcohol/week: 0.6 oz    Types: 1 Cans of beer per week    Comment: occ.  . Drug use: Never    Allergies as of 11/01/2017  . (No Known Allergies)    Review of Systems:    All systems reviewed and negative except where noted in HPI.   Physical Exam:  BP 124/76   Pulse 73   Temp 97.9 F (36.6 C) (Oral)   Ht 5' 11"  (1.803 m)   Wt 138 lb  12.8 oz (63 kg)   BMI 19.36 kg/m  No LMP for male patient. Psych:  Alert and cooperative. Normal mood and affect. General:   Alert,  Well-developed, well-nourished, pleasant and cooperative in NAD Head:  Normocephalic and atraumatic. Eyes:  Sclera clear, no icterus.   Conjunctiva pink. Ears:  Normal auditory acuity. Nose:  No deformity, discharge, or lesions. Mouth:  No deformity or lesions,oropharynx pink & moist. Neck:  Supple; no masses or thyromegaly. Lungs:  Respirations even and unlabored.  Clear throughout to auscultation.   No wheezes, crackles, or rhonchi. No acute distress. Heart:  Regular rate and rhythm; no murmurs, clicks, rubs, or gallops. Abdomen:  Normal bowel sounds.  No bruits.  Soft, non-tender and non-distended without masses, hepatosplenomegaly or hernias noted.  No guarding or rebound tenderness.    Msk:  Symmetrical without gross deformities. Good, equal movement & strength bilaterally. Pulses:  Normal pulses  noted. Extremities:  No clubbing or edema.  No cyanosis. Neurologic:  Alert and oriented x3;  grossly normal neurologically. Skin:  Intact without significant lesions or rashes. No jaundice. Lymph Nodes:  No significant cervical adenopathy. Psych:  Alert and cooperative. Normal mood and affect.   Labs: CBC    Component Value Date/Time   WBC 6.0 10/24/2017 1441   RBC 5.28 10/24/2017 1441   HGB 12.2 (L) 10/24/2017 1441   HGB 13.2 07/05/2013 0747   HCT 38.4 (L) 10/24/2017 1441   HCT 41.6 07/05/2013 0747   PLT 214 10/24/2017 1441   PLT 190 07/05/2013 0747   MCV 72.8 (L) 10/24/2017 1441   MCV 73 (L) 07/05/2013 0747   MCH 23.2 (L) 10/24/2017 1441   MCHC 31.8 (L) 10/24/2017 1441   RDW 14.0 10/24/2017 1441   RDW 13.4 07/05/2013 0747   CMP     Component Value Date/Time   NA 144 10/24/2017 1441   NA 133 (L) 07/05/2013 0747   K 4.0 10/24/2017 1441   K 3.8 07/05/2013 0747   CL 109 10/24/2017 1441   CL 100 07/05/2013 0747   CO2 28 10/24/2017 1441   CO2 32 07/05/2013 0747   GLUCOSE 70 10/24/2017 1441   GLUCOSE 94 07/05/2013 0747   BUN 17 10/24/2017 1441   BUN 10 07/05/2013 0747   CREATININE 0.77 10/24/2017 1441   CREATININE 1.05 07/05/2013 0747   CALCIUM 9.4 10/24/2017 1441   CALCIUM 9.4 07/05/2013 0747   PROT 7.5 10/24/2017 1441   ALBUMIN 4.3 10/24/2017 1441   AST 18 10/24/2017 1441   ALT 8 (L) 10/24/2017 1441   ALKPHOS 47 10/24/2017 1441   BILITOT 0.5 10/24/2017 1441   GFRNONAA >60 10/24/2017 1441   GFRNONAA >60 07/05/2013 0747   GFRAA >60 10/24/2017 1441   GFRAA >60 07/05/2013 0747    Imaging Studies: No results found.  Assessment and Plan:   Cameron Payne is a 42 y.o. y/o male has been referred for bright red blood per rectum, that started about 1 to 1-1/2 weeks ago, and has resolved risks of the last 2 to 3 days, with lab work showing microcytic anemia  Bright red blood per rectum could have been due to hemorrhoids versus infectious diarrhea versus  diverticulosis Less likely to be ischemic colitis Microcytic anemia suggest iron deficiency, malignancy needs to be ruled out as well We will obtain iron levels and ferritin Iron deficiency anemia, will need evaluation with EGD and colonoscopy We will schedule at this time I have discussed alternative options, risks & benefits,  which include, but  are not limited to, bleeding, infection, perforation,respiratory complication & drug reaction.  The patient and mother agrees with this plan & written consent will be obtained.    However, we will also obtain GI stool panel, and C. difficile testing, as patient reports loose stools that have now resolved.  Therefore, he might have had infectious diarrhea at the time.  If stool testing is positive for infection, colonoscopy and EGD will need to be postponed We will obtain fecal calprotectin as well, and serum CRP to evaluate for IBD  No bright red blood per rectum at this time, to indicate urgent endoscopy If symptoms change, patient was asked to call us, mother and patient verbalized understanding  Avoid NSAIDs, will repeat CBC as well   Dr Cameron Payne

## 2017-11-01 NOTE — Patient Instructions (Signed)
F/U 3 months  High-Fiber Diet Fiber, also called dietary fiber, is a type of carbohydrate found in fruits, vegetables, whole grains, and beans. A high-fiber diet can have many health benefits. Your health care provider may recommend a high-fiber diet to help:  Prevent constipation. Fiber can make your bowel movements more regular.  Lower your cholesterol.  Relieve hemorrhoids, uncomplicated diverticulosis, or irritable bowel syndrome.  Prevent overeating as part of a weight-loss plan.  Prevent heart disease, type 2 diabetes, and certain cancers.  What is my plan? The recommended daily intake of fiber includes:  38 grams for men under age 53.  86 grams for men over age 55.  52 grams for women under age 45.  54 grams for women over age 69.  You can get the recommended daily intake of dietary fiber by eating a variety of fruits, vegetables, grains, and beans. Your health care provider may also recommend a fiber supplement if it is not possible to get enough fiber through your diet. What do I need to know about a high-fiber diet?  Fiber supplements have not been widely studied for their effectiveness, so it is better to get fiber through food sources.  Always check the fiber content on thenutrition facts label of any prepackaged food. Look for foods that contain at least 5 grams of fiber per serving.  Ask your dietitian if you have questions about specific foods that are related to your condition, especially if those foods are not listed in the following section.  Increase your daily fiber consumption gradually. Increasing your intake of dietary fiber too quickly may cause bloating, cramping, or gas.  Drink plenty of water. Water helps you to digest fiber. What foods can I eat? Grains Whole-grain breads. Multigrain cereal. Oats and oatmeal. Brown rice. Barley. Bulgur wheat. [place]. Bran muffins. Popcorn. Rye wafer crackers. Vegetables Sweet potatoes. Spinach. Kale. Artichokes.  Cabbage. Broccoli. Green peas. Carrots. Squash. Fruits Berries. Pears. Apples. Oranges. Avocados. Prunes and raisins. Dried figs. Meats and Other Protein Sources Navy, kidney, pinto, and soy beans. Split peas. Lentils. Nuts and seeds. Dairy Fiber-fortified yogurt. Beverages Fiber-fortified soy milk. Fiber-fortified orange juice. Other Fiber bars. The items listed above may not be a complete list of recommended foods or beverages. Contact your dietitian for more options. What foods are not recommended? Grains White bread. Pasta made with refined flour. White rice. Vegetables Fried potatoes. Canned vegetables. Well-cooked vegetables. Fruits Fruit juice. Cooked, strained fruit. Meats and Other Protein Sources Fatty cuts of meat. Fried Sales executive or fried fish. Dairy Milk. Yogurt. Cream cheese. Sour cream. Beverages Soft drinks. Other Cakes and pastries. Butter and oils. The items listed above may not be a complete list of foods and beverages to avoid. Contact your dietitian for more information. What are some tips for including high-fiber foods in my diet?  Eat a wide variety of high-fiber foods.  Make sure that half of all grains consumed each day are whole grains.  Replace breads and cereals made from refined flour or white flour with whole-grain breads and cereals.  Replace white rice with brown rice, bulgur wheat, or millet.  Start the day with a breakfast that is high in fiber, such as a cereal that contains at least 5 grams of fiber per serving.  Use beans in place of meat in soups, salads, or pasta.  Eat high-fiber snacks, such as berries, raw vegetables, nuts, or popcorn. This information is not intended to replace advice given to you by your health care provider. Make  sure you discuss any questions you have with your health care provider. Document Released: 05/16/2005 Document Revised: 10/22/2015 Document Reviewed: 10/29/2013 Elsevier Interactive Patient Education   [employer].

## 2017-11-02 ENCOUNTER — Ambulatory Visit: Payer: Medicare Other | Admitting: Gastroenterology

## 2017-11-08 ENCOUNTER — Telehealth: Payer: Self-pay

## 2017-11-08 ENCOUNTER — Other Ambulatory Visit: Payer: Self-pay

## 2017-11-08 DIAGNOSIS — D509 Iron deficiency anemia, unspecified: Secondary | ICD-10-CM

## 2017-11-08 DIAGNOSIS — K625 Hemorrhage of anus and rectum: Secondary | ICD-10-CM

## 2017-11-08 DIAGNOSIS — R197 Diarrhea, unspecified: Secondary | ICD-10-CM

## 2017-11-08 NOTE — Telephone Encounter (Signed)
Attempted to contact mother as asked per [facility] to let her know about the GI profile that the diagnosis was changed per Dr. Bonna Gains to diarrhea and new order was placed.

## 2017-11-09 LAB — CBC
HEMATOCRIT: 41.2 % (ref 37.5–51.0)
HEMOGLOBIN: 12.7 g/dL — AB (ref 13.0–17.7)
MCH: 22.8 pg — ABNORMAL LOW (ref 26.6–33.0)
MCHC: 30.8 g/dL — ABNORMAL LOW (ref 31.5–35.7)
MCV: 74 fL — ABNORMAL LOW (ref 79–97)
Platelets: 234 10*3/uL (ref 150–450)
RBC: 5.58 x10E6/uL (ref 4.14–5.80)
RDW: 13.7 % (ref 12.3–15.4)
WBC: 5.2 10*3/uL (ref 3.4–10.8)

## 2017-11-09 LAB — IRON AND TIBC
Iron Saturation: 28 % (ref 15–55)
Iron: 67 ug/dL (ref 38–169)
TIBC: 239 ug/dL — AB (ref 250–450)
UIBC: 172 ug/dL (ref 111–343)

## 2017-11-09 LAB — C-REACTIVE PROTEIN

## 2017-11-09 LAB — FERRITIN: FERRITIN: 504 ng/mL — AB (ref 30–400)

## 2017-11-10 NOTE — Telephone Encounter (Signed)
VMB has not been set up yet. (I could not get through to [facility] to see if pt needed to bring another specimen or not, the diagnosis was changed so hopefully it will be covered).

## 2017-11-10 NOTE — Telephone Encounter (Signed)
Someone LVM checking to see if the patients lab orders are in. No name was given but she would like for you to call her today and let her know. [phone]

## 2017-11-13 LAB — CALPROTECTIN, FECAL: Calprotectin, Fecal: 126 ug/g — ABNORMAL HIGH (ref 0–120)

## 2017-11-13 NOTE — Telephone Encounter (Signed)
I spoke with [facility] rep. And they have a container of pts with stool but it cannot be used for this GI profile, it would need to be orange top, para pak. I am unable to reach his mother at this time. I have tried all three numbers. Male at home said to try her work but I got a Designer, television/film set. So no message was left.

## 2017-11-14 ENCOUNTER — Encounter: Payer: Self-pay | Admitting: Student

## 2017-11-14 NOTE — Telephone Encounter (Signed)
I spoke with pt's mother Mariann Laster). Pt has not been able to do his stool sample yet. Even if stool sample obtained today, the results would most likely not be ready in time so we did reschedule his procedure to 11/22/17. [facility] also notified.

## 2017-11-15 ENCOUNTER — Telehealth: Payer: Self-pay | Admitting: Gastroenterology

## 2017-11-15 NOTE — Telephone Encounter (Signed)
Pt's mother brings an ABN back to office because of medicare denying coverage of GI profile panel. This is the second time. The first time it was ordered under the diagnosis of microcytic anemia and bright red blood per rectum and the second time for diarrhea as per Dr. Bonna Gains, MD.  I spoke with pt's mother and gave her the diagnosis codes that were used each time and that they will not cover test from what I see. She was informed it was the insurance denying this. She may contact them later today.

## 2017-11-15 NOTE — Telephone Encounter (Signed)
Pt mother called to speak with Jackelyn Poling about [facility] please call [place] at [place] for codes [phone]

## 2017-11-17 LAB — GI PROFILE, STOOL, PCR
ADENOVIRUS F 40/41: NOT DETECTED
ASTROVIRUS: NOT DETECTED
C difficile toxin A/B: NOT DETECTED
CYCLOSPORA CAYETANENSIS: NOT DETECTED
Campylobacter: NOT DETECTED
Cryptosporidium: NOT DETECTED
ENTAMOEBA HISTOLYTICA: NOT DETECTED
ENTEROPATHOGENIC E COLI: NOT DETECTED
Enteroaggregative E coli: NOT DETECTED
Enterotoxigenic E coli: NOT DETECTED
Giardia lamblia: NOT DETECTED
Norovirus GI/GII: NOT DETECTED
Plesiomonas shigelloides: NOT DETECTED
Rotavirus A: NOT DETECTED
SAPOVIRUS: NOT DETECTED
Salmonella: NOT DETECTED
Shiga-toxin-producing E coli: NOT DETECTED
Shigella/Enteroinvasive E coli: NOT DETECTED
Vibrio cholerae: NOT DETECTED
Vibrio: NOT DETECTED
Yersinia enterocolitica: NOT DETECTED

## 2017-11-17 NOTE — Telephone Encounter (Signed)
Specimen was sent off per lab and the mother said she would deal with it (bill) later.

## 2017-11-21 ENCOUNTER — Telehealth: Payer: Self-pay

## 2017-11-21 NOTE — Telephone Encounter (Signed)
Mother notified that stool specimen was normal.  Pt is doing his prep for tomorrow.

## 2017-11-21 NOTE — Telephone Encounter (Signed)
-----   Message from Virgel Manifold, MD sent at 11/20/2017  9:41 AM EDT ----- Jackelyn Poling please let patient know, infectious workup was negative. Go ahead and schedule his procedure. Thank you

## 2017-11-22 ENCOUNTER — Ambulatory Visit
Admission: RE | Admit: 2017-11-22 | Discharge: 2017-11-22 | Disposition: A | Discharge status: Home / Self Care | Payer: Medicare Other | Source: Ambulatory Visit | Attending: Gastroenterology | Admitting: Gastroenterology

## 2017-11-22 ENCOUNTER — Encounter: Payer: Self-pay | Admitting: *Deleted

## 2017-11-22 ENCOUNTER — Ambulatory Visit: Payer: Medicare Other | Admitting: Anesthesiology

## 2017-11-22 ENCOUNTER — Encounter
Admission: RE | Disposition: A | Discharge status: Home / Self Care | Payer: Self-pay | Source: Ambulatory Visit | Attending: Gastroenterology

## 2017-11-22 DIAGNOSIS — K6389 Other specified diseases of intestine: Secondary | ICD-10-CM

## 2017-11-22 DIAGNOSIS — D649 Anemia, unspecified: Secondary | ICD-10-CM

## 2017-11-22 DIAGNOSIS — K625 Hemorrhage of anus and rectum: Secondary | ICD-10-CM

## 2017-11-22 DIAGNOSIS — F17290 Nicotine dependence, other tobacco product, uncomplicated: Secondary | ICD-10-CM | POA: Diagnosis not present

## 2017-11-22 DIAGNOSIS — I10 Essential (primary) hypertension: Secondary | ICD-10-CM | POA: Insufficient documentation

## 2017-11-22 DIAGNOSIS — K3189 Other diseases of stomach and duodenum: Secondary | ICD-10-CM | POA: Diagnosis not present

## 2017-11-22 DIAGNOSIS — K529 Noninfective gastroenteritis and colitis, unspecified: Secondary | ICD-10-CM | POA: Diagnosis not present

## 2017-11-22 DIAGNOSIS — K228 Other specified diseases of esophagus: Secondary | ICD-10-CM | POA: Diagnosis not present

## 2017-11-22 DIAGNOSIS — K2289 Other specified disease of esophagus: Secondary | ICD-10-CM

## 2017-11-22 DIAGNOSIS — K6289 Other specified diseases of anus and rectum: Secondary | ICD-10-CM

## 2017-11-22 DIAGNOSIS — K5289 Other specified noninfective gastroenteritis and colitis: Secondary | ICD-10-CM | POA: Diagnosis not present

## 2017-11-22 DIAGNOSIS — K921 Melena: Secondary | ICD-10-CM

## 2017-11-22 DIAGNOSIS — R197 Diarrhea, unspecified: Secondary | ICD-10-CM

## 2017-11-22 DIAGNOSIS — Z79899 Other long term (current) drug therapy: Secondary | ICD-10-CM | POA: Insufficient documentation

## 2017-11-22 DIAGNOSIS — D509 Iron deficiency anemia, unspecified: Secondary | ICD-10-CM | POA: Diagnosis present

## 2017-11-22 HISTORY — PX: COLONOSCOPY WITH PROPOFOL: SHX5780

## 2017-11-22 HISTORY — PX: ESOPHAGOGASTRODUODENOSCOPY (EGD) WITH PROPOFOL: SHX5813

## 2017-11-22 LAB — KOH PREP
KOH Prep: NONE SEEN
Special Requests: NORMAL

## 2017-11-22 SURGERY — COLONOSCOPY WITH PROPOFOL
Anesthesia: General

## 2017-11-22 MED ORDER — PROPOFOL 10 MG/ML IV BOLUS
INTRAVENOUS | Status: DC | PRN
  Administered 2017-11-22: 100 mg via INTRAVENOUS

## 2017-11-22 MED ORDER — PROPOFOL 500 MG/50ML IV EMUL
INTRAVENOUS | Status: DC | PRN
  Administered 2017-11-22: 150 ug/kg/min via INTRAVENOUS

## 2017-11-22 MED ORDER — PROPOFOL 500 MG/50ML IV EMUL
INTRAVENOUS | Status: AC
  Filled 2017-11-22: qty 50

## 2017-11-22 MED ORDER — FENTANYL CITRATE (PF) 100 MCG/2ML IJ SOLN
INTRAMUSCULAR | Status: DC | PRN
  Administered 2017-11-22 (×2): 50 ug via INTRAVENOUS

## 2017-11-22 MED ORDER — SODIUM CHLORIDE 0.9 % IV SOLN
INTRAVENOUS | Status: DC
  Administered 2017-11-22 (×2): via INTRAVENOUS

## 2017-11-22 MED ORDER — LIDOCAINE 2% (20 MG/ML) 5 ML SYRINGE
INTRAMUSCULAR | Status: DC | PRN
  Administered 2017-11-22: 30 mg via INTRAVENOUS

## 2017-11-22 MED ORDER — FENTANYL CITRATE (PF) 100 MCG/2ML IJ SOLN
INTRAMUSCULAR | Status: AC
  Filled 2017-11-22: qty 2

## 2017-11-22 MED ORDER — PHENYLEPHRINE HCL 10 MG/ML IJ SOLN
INTRAMUSCULAR | Status: DC | PRN
  Administered 2017-11-22 (×3): 100 ug via INTRAVENOUS

## 2017-11-22 MED ORDER — GLYCOPYRROLATE 0.2 MG/ML IJ SOLN
INTRAMUSCULAR | Status: DC | PRN
  Administered 2017-11-22: 0.2 mg via INTRAVENOUS

## 2017-11-22 MED ORDER — LIDOCAINE HCL (PF) 2 % IJ SOLN
INTRAMUSCULAR | Status: AC
  Filled 2017-11-22: qty 10

## 2017-11-22 NOTE — H&P (Signed)
[name], MD [address] [place], [place], [place], [address] [address], [place], [place], [place], [address] Phone: [phone]  Fax: [phone]  Primary Care Physician:  Elba Barman, MD   Pre-Procedure History & Physical: HPI:  Cameron Payne is a 42 y.o. male is here for a colonoscopy and EGD.   Past Medical History:  Diagnosis Date  . Developmental disability   . Hypertension   . Seizures ([place])    as a child    History reviewed. No pertinent surgical history.  Prior to Admission medications   Medication Sig Start Date End Date Taking? Authorizing Provider  fexofenadine (ALLEGRA) 180 MG tablet Take 180 mg by mouth daily.   Yes [provider]  hydrochlorothiazide (MICROZIDE) 12.5 MG capsule Take 12.5 mg by mouth daily.    [provider]  naproxen (NAPROSYN) 500 MG tablet Take 1 tablet (500 mg total) by mouth 2 (two) times daily with a meal. Patient not taking: Reported on 01/16/2017 03/14/16   Sable Feil, PA-C    Allergies as of 11/01/2017  . (No Known Allergies)    Family History  Problem Relation Age of Onset  . Diabetes Mother   . Heart failure Mother   . Cancer Father        liver  . Cancer Paternal Grandfather        does not know what kind  . Cancer Maternal Aunt        breast (half sister) pt mother was adopted  . Colon cancer Neg Hx   . Thyroid disease Neg Hx     Social History   Socioeconomic History  . Marital status: Single    Spouse name: Not on file  . Number of children: Not on file  . Years of education: Not on file  . Highest education level: Not on file  Occupational History  . Not on file  Social Needs  . Financial resource strain: Not on file  . Food insecurity:    Worry: Not on file    Inability: Not on file  . Transportation needs:    Medical: Not on file    Non-medical: Not on file  Tobacco Use  . Smoking status: Light Tobacco Smoker    Types: Cigars  . Smokeless tobacco:  Never Used  . Tobacco comment: occ.   Substance and Sexual Activity  . Alcohol use: Yes    Alcohol/week: 0.6 oz    Types: 1 Cans of beer per week    Comment: occ.  . Drug use: Never  . Sexual activity: Not on file  Lifestyle  . Physical activity:    Days per week: Not on file    Minutes per session: Not on file  . Stress: Not on file  Relationships  . Social connections:    Talks on phone: Not on file    Gets together: Not on file    Attends religious service: Not on file    Active member of club or organization: Not on file    Attends meetings of clubs or organizations: Not on file    Relationship status: Not on file  . Intimate partner violence:    Fear of current or ex partner: Not on file    Emotionally abused: Not on file    Physically abused: Not on file    Forced sexual activity: Not on file  Other Topics Concern  . Not on file  Social History Narrative  . Not on  file    Review of Systems: See HPI, otherwise negative ROS  Physical Exam: BP 140/88   Pulse 65   Temp (!) 97.1 F (36.2 C) (Tympanic)   Resp 16   Ht 5' 11"  (1.803 m)   Wt 138 lb (62.6 kg)   SpO2 100%   BMI 19.25 kg/m  General:   Alert,  pleasant and cooperative in NAD Head:  Normocephalic and atraumatic. Neck:  Supple; no masses or thyromegaly. Lungs:  Clear throughout to auscultation, normal respiratory effort.    Heart:  +S1, +S2, Regular rate and rhythm, No edema. Abdomen:  Soft, nontender and nondistended. Normal bowel sounds, without guarding, and without rebound.   Neurologic:  Alert and  oriented x4;  grossly normal neurologically.  Impression/Plan: Cameron Payne is here for a colonoscopy to be performed for and EGD for hematochezia, microcytic anemia, diarrhea, elevated fecal calprotectin   Risks, benefits, limitations, and alternatives regarding the procedures have been reviewed with the patient and mother.  Questions have been answered.  All parties agreeable.   Virgel Manifold, MD  11/22/2017, 10:30 AM

## 2017-11-22 NOTE — Anesthesia Postprocedure Evaluation (Signed)
Anesthesia Post Note  Patient: Cameron Payne  Procedure(s) Performed: COLONOSCOPY WITH PROPOFOL (N/A ) ESOPHAGOGASTRODUODENOSCOPY (EGD) WITH PROPOFOL (N/A )  Patient location during evaluation: Endoscopy Anesthesia Type: General Level of consciousness: awake and alert Pain management: pain level controlled Vital Signs Assessment: post-procedure vital signs reviewed and stable Respiratory status: spontaneous breathing, nonlabored ventilation, respiratory function stable and patient connected to nasal cannula oxygen Cardiovascular status: blood pressure returned to baseline and stable Postop Assessment: no apparent nausea or vomiting Anesthetic complications: no     Last Vitals:  Vitals:   11/22/17 1217 11/22/17 1227  BP: (!) 118/94   Pulse: (!) 55   Resp: 13 14  Temp:    SpO2: 90%     Last Pain:  Vitals:   11/22/17 1227  TempSrc:   PainSc: 0-No pain                 Martha Clan

## 2017-11-22 NOTE — Anesthesia Post-op Follow-up Note (Signed)
Anesthesia QCDR form completed.        

## 2017-11-22 NOTE — Op Note (Signed)
[facility] Gastroenter[facility] Patient Name: Cameron Payne Procedure Date: 11/22/2017 10:30 AM MRN: [id number] Account #: [id number] Date of Birth: 05/20/1976 Admit Type: Outpatient Age: 42 Room: [facility] ENDO ROOM 2 Gender: Male Note Status: Finalized Procedure:            Upper GI endoscopy Indications:          Anemia, Diarrhea Providers:            Varnita B. Bonna Gains MD, MD Referring MD:         Marrianne Mood, MD (Referring MD) Medicines:            Monitored Anesthesia Care Complications:        No immediate complications. Procedure:            Pre-Anesthesia Assessment:                       - Prior to the procedure, a History and Physical was                        performed, and patient medications, allergies and                        sensitivities were reviewed. The patient's tolerance of                        previous anesthesia was reviewed.                       - The risks and benefits of the procedure and the                        sedation options and risks were discussed with the                        patient. All questions were answered and informed                        consent was obtained.                       - Patient identification and proposed procedure were                        verified prior to the procedure by the physician, the                        nurse, the anesthesiologist, the anesthetist and the                        technician. The procedure was verified in the procedure                        room.                       - ASA Grade Assessment: II - A patient with mild                        systemic disease.                       After  obtaining informed consent, the endoscope was                        passed under direct vision. Throughout the procedure,                        the patient's blood pressure, pulse, and oxygen                        saturations were monitored continuously. The Endoscope     was introduced through the mouth, and advanced to the                        second part of duodenum. The upper GI endoscopy was                        accomplished with ease. The patient tolerated the                        procedure well. Findings:      White nummular lesions were noted in the distal esophagus. Brushings for       KOH prep were obtained in the distal esophagus.      The exam of the esophagus was otherwise normal.      The entire examined stomach was normal.      The duodenal bulb, second portion of the duodenum and examined duodenum       were normal. Biopsies for histology were taken with a cold forceps for       evaluation of celiac disease. Impression:           - White nummular lesions in esophageal mucosa.                        Brushings performed.                       - Normal stomach.                       - Normal duodenal bulb, second portion of the duodenum                        and examined duodenum. Biopsied. Recommendation:       - Await pathology results.                       - Perform a colonoscopy today.                       - Advance diet as tolerated.                       - Continue present medications.                       - Patient has a contact number available for                        emergencies. The signs and symptoms of potential                        delayed complications were discussed with the  patient.                        Return to normal activities tomorrow. Written discharge                        instructions were provided to the patient.                       - Discharge patient to home (with escort).                       - The findings and recommendations were discussed with                        the patient.                       - The findings and recommendations were discussed with                        the patient's family. Procedure Code(s):    --- Professional ---                       [phone],  Esophagogastroduodenoscopy, flexible, transoral;                        with biopsy, single or multiple Diagnosis Code(s):    --- Professional ---                       K22.8, Other specified diseases of esophagus                       D64.9, Anemia, unspecified                       R19.7, Diarrhea, unspecified CPT copyright 2017 American Medical Association. All rights reserved. The codes documented in this report are preliminary and upon coder review may  be revised to meet current compliance requirements.  [name], MD Margretta Sidle B. Bonna Gains MD, MD 11/22/2017 10:57:15 AM This report has been signed electronically. Number of Addenda: 0 Note Initiated On: 11/22/2017 10:30 AM Estimated Blood Loss: Estimated blood loss: none.      [facility]

## 2017-11-22 NOTE — Op Note (Signed)
[facility] Gastroenterology Patient Name: Lebron Nauert Procedure Date: 11/22/2017 10:29 AM MRN: [id number] Account #: [id number] Date of Birth: [birth year] Admit Type: Outpatient Age: 42 Room: [facility] ENDO ROOM 2 Gender: Male Note Status: Finalized Procedure:            Colonoscopy Indications:          Hematochezia, FECAL CALPROTECTIN POSITIVE Providers:            Elicia Lui B. Bonna Gains MD, MD Referring MD:         Marrianne Mood, MD (Referring MD) Medicines:            Monitored Anesthesia Care Complications:        No immediate complications. Procedure:            Pre-Anesthesia Assessment:                       - Prior to the procedure, a History and Physical was                        performed, and patient medications, allergies and                        sensitivities were reviewed. The patient's tolerance of                        previous anesthesia was reviewed.                       - The risks and benefits of the procedure and the                        sedation options and risks were discussed with the                        patient. All questions were answered and informed                        consent was obtained.                       - Patient identification and proposed procedure were                        verified prior to the procedure by the physician, the                        nurse, the anesthesiologist, the anesthetist and the                        technician. The procedure was verified in the                        pre-procedure area in the procedure room in the                        endoscopy suite.                       - Prophylactic Antibiotics: The patient does not  require prophylactic antibiotics.                       - ASA Grade Assessment: II - A patient with mild                        systemic disease.                       - After reviewing the risks and benefits, the patient                        was  deemed in satisfactory condition to undergo the                        procedure.                       - Monitored anesthesia care was determined to be                        medically necessary for this procedure based on review                        of the patient's medical history, medications, and                        prior anesthesia history.                       - The anesthesia plan was to use monitored anesthesia                        care (MAC).                       After obtaining informed consent, the colonoscope was                        passed under direct vision. Throughout the procedure,                        the patient's blood pressure, pulse, and oxygen                        saturations were monitored continuously. The                        Colonoscope was introduced through the anus and                        advanced to the the terminal ileum. The colonoscopy was                        performed with ease. The patient tolerated the                        procedure well. The quality of the bowel preparation                        was good. Findings:      The terminal ileum appeared normal. Biopsies were taken with  a cold       forceps for histology.      A localized area of mildly erythematous mucosa was found appendiceal       orifice. Biopsies were taken with a cold forceps for histology.      Patchy mild mucosal changes characterized by congestion (edema),       erythema and granularity were found in the rectum. Biopsies were taken       with a cold forceps for histology.      The sigmoid colon, descending colon, transverse colon, ascending colon       and cecum appeared normal. Biopsies were taken with a cold forceps for       histology.      The retroflexed view of the distal rectum and anal verge was normal and       showed no anal or rectal abnormalities. Impression:           - The examination was suspicious for rectal ulcerative                         colitis ulcerative colitis.                       - The examined portion of the ileum was normal.                        Biopsied.                       - Erythematous mucosa at the appendiceal orifice.                        Biopsied.                       - Patchy mild mucosal changes were found in the rectum,                        rule out ulcerative colitis. Biopsied.                       - The sigmoid colon, descending colon, transverse                        colon, ascending colon and cecum are normal. Biopsied.                       - The distal rectum and anal verge are normal on                        retroflexion view. Recommendation:       - Await pathology results.                       - Continue present medications.                       - Return to my office in 2 weeks.                       - Return to primary care physician as previously  scheduled.                       - The findings and recommendations were discussed with                        the patient.                       - The findings and recommendations were discussed with                        the patient's family. Procedure Code(s):    --- Professional ---                       [phone], Colonoscopy, flexible; with biopsy, single or                        multiple Diagnosis Code(s):    --- Professional ---                       K63.89, Other specified diseases of intestine                       K62.89, Other specified diseases of anus and rectum                       K92.1, Melena (includes Hematochezia) CPT copyright 2017 American Medical Association. All rights reserved. The codes documented in this report are preliminary and upon coder review may  be revised to meet current compliance requirements.  [name], MD Margretta Sidle B. Bonna Gains MD, MD 11/22/2017 11:48:23 AM This report has been signed electronically. Number of Addenda: 0 Note Initiated On: 11/22/2017 10:29  AM Scope Withdrawal Time: 0 hours 28 minutes 35 seconds  Total Procedure Duration: 0 hours 39 minutes 12 seconds  Estimated Blood Loss: Estimated blood loss: none.      [facility]

## 2017-11-22 NOTE — Anesthesia Preprocedure Evaluation (Signed)
Anesthesia Evaluation  Patient identified by MRN, date of birth, ID band Patient awake    Reviewed: Allergy & Precautions, H&P , NPO status , Patient's Chart, lab work & pertinent test results, reviewed documented beta blocker date and time   History of Anesthesia Complications Negative for: history of anesthetic complications  Airway Mallampati: I  TM Distance: >3 FB Neck ROM: full    Dental  (+) Dental Advidsory Given, Teeth Intact   Pulmonary neg shortness of breath, neg COPD, neg recent URI, Current Smoker,           Cardiovascular Exercise Tolerance: Good hypertension, (-) angina(-) CAD, (-) Past MI, (-) Cardiac Stents and (-) CABG (-) dysrhythmias (-) Valvular Problems/Murmurs     Neuro/Psych Seizures - (as a child),  negative psych ROS   GI/Hepatic negative GI ROS, Neg liver ROS,   Endo/Other  negative endocrine ROS  Renal/GU negative Renal ROS  negative genitourinary   Musculoskeletal   Abdominal   Peds  Hematology negative hematology ROS (+)   Anesthesia Other Findings Past Medical History: No date: Developmental disability No date: Hypertension No date: Seizures (HCC)     Comment:  as a child   Reproductive/Obstetrics negative OB ROS                             Anesthesia Physical Anesthesia Plan  ASA: II  Anesthesia Plan: General   Post-op Pain Management:    Induction: Intravenous  PONV Risk Score and Plan: 1 and Propofol infusion  Airway Management Planned: Nasal Cannula  Additional Equipment:   Intra-op Plan:   Post-operative Plan:   Informed Consent: I have reviewed the patients History and Physical, chart, labs and discussed the procedure including the risks, benefits and alternatives for the proposed anesthesia with the patient or authorized representative who has indicated his/her understanding and acceptance.   Dental Advisory Given  Plan Discussed  with: Anesthesiologist, CRNA and Surgeon  Anesthesia Plan Comments:         Anesthesia Quick Evaluation

## 2017-11-22 NOTE — Transfer of Care (Signed)
Immediate Anesthesia Transfer of Care Note  Patient: Cameron Payne  Procedure(s) Performed: COLONOSCOPY WITH PROPOFOL (N/A ) ESOPHAGOGASTRODUODENOSCOPY (EGD) WITH PROPOFOL (N/A )  Patient Location: PACU and Endoscopy Unit  Anesthesia Type:General  Level of Consciousness: sedated  Airway & Oxygen Therapy: Patient Spontanous Breathing and Patient connected to nasal cannula oxygen  Post-op Assessment: Report given to RN and Post -op Vital signs reviewed and stable  Post vital signs: Reviewed and stable  Last Vitals:  Vitals Value Taken Time  BP    Temp    Pulse 66 11/22/2017 11:48 AM  Resp 15 11/22/2017 11:48 AM  SpO2 100 % 11/22/2017 11:48 AM  Vitals shown include unvalidated device data.  Last Pain:  Vitals:   11/22/17 0941  TempSrc: Tympanic         Complications: No apparent anesthesia complications

## 2017-11-24 ENCOUNTER — Encounter: Payer: Self-pay | Admitting: Gastroenterology

## 2017-11-27 LAB — SURGICAL PATHOLOGY

## 2017-11-28 ENCOUNTER — Ambulatory Visit (INDEPENDENT_AMBULATORY_CARE_PROVIDER_SITE_OTHER): Payer: Medicare Other | Admitting: Gastroenterology

## 2017-11-28 ENCOUNTER — Encounter: Payer: Self-pay | Admitting: Gastroenterology

## 2017-11-28 VITALS — BP 122/72 | HR 69 | Wt 139.0 lb

## 2017-11-28 DIAGNOSIS — K5120 Ulcerative (chronic) proctitis without complications: Secondary | ICD-10-CM

## 2017-11-28 DIAGNOSIS — K625 Hemorrhage of anus and rectum: Secondary | ICD-10-CM | POA: Diagnosis not present

## 2017-11-28 MED ORDER — MESALAMINE 1000 MG RE SUPP: 1000.0000 mg | Freq: Every day | RECTAL | 2 refills | Status: DC

## 2017-11-28 MED ORDER — MESALAMINE 800 MG PO TBEC: 800.0000 mg | DELAYED_RELEASE_TABLET | Freq: Three times a day (TID) | ORAL | 2 refills | Status: DC

## 2017-11-29 NOTE — Progress Notes (Signed)
Cameron Antigua, MD [address]  [place]  [place], [place] [address]  Main: [phone]  Fax: [phone]   Primary Care Physician: Elba Barman, MD  Primary Gastroenterologist:  Dr. Vonda Payne  Chief Complaint  Patient presents with  . Follow-up    colonoscopy/EGD on 11/22/2017    HPI: Cameron Payne is a 42 y.o. 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  No further bright red blood per rectum.  Reports 1-2 soft formed bowel movements a day without blood.  No abdominal pain.  No nausea or vomiting.  Initially seen in clinic: November 01, 2017  Presenting symptom in clinic: Intermittent bright red blood per rectum.  Mother noted weight loss at home. Pertinent lab findings: Fecal calprotectin mildly elevated at 126  Index colonoscopy: November 22, 2017 Colonoscopy findings: Impression:           - The examination was suspicious for rectal ulcerative                        colitis ulcerative colitis.                       - The examined portion of the ileum was normal.                        Biopsied.                       - Erythematous mucosa at the appendiceal orifice.                        Biopsied.                       - Patchy mild mucosal changes were found in the rectum,                        rule out ulcerative colitis. Biopsied.                       - The sigmoid colon, descending colon, transverse                        colon, ascending colon and cecum are normal. Biopsied.                       - The distal rectum and anal verge are normal on                        retroflexion view.  EGD: November 22, 2017 Impression:           - White nummular lesions in esophageal mucosa.                        Brushings performed.                       - Normal stomach.                       - Normal duodenal bulb, second portion of the duodenum  and examined duodenum. Biopsied.   Surgical Pathology     DIAGNOSIS:  A. DUODENUM, SECOND PORTION END BULB; COLD BIOPSY:  - DUODENAL MUCOSA WITH PRESERVED VILLOUS ARCHITECTURE AND FOCAL  BRUNNER'S GLAND HYPERPLASIA.  - NEGATIVE FOR INTRA-EPITHELIAL LYMPHOCYTOSIS, DYSPLASIA AND MALIGNANCY.   B. TERMINAL ILEUM; COLD BIOPSY:  - SMALL BOWEL MUCOSA WITH INTACT VILLOUS ARCHITECTURE.  - NEGATIVE FOR INTRAEPITHELIAL LYMPHOCYTOSIS, DYSPLASIA AND MALIGNANCY.   C. COLON, APPENDICEAL ORIFICE; COLD BIOPSY:  - MILD ACTIVE COLITIS.  - NEGATIVE FOR DYSPLASIA AND MALIGNANCY.   D. COLON, CECUM AND ASCENDING; COLD BIOPSY:  - MUCOSAL EDEMA AND PROMINENT LYMPHOID AGGREGATES.  - NEGATIVE FOR DYSPLASIA AND MALIGNANCY.   E. TRANSVERSE COLON; COLD BIOPSY:  - MUCOSAL HEMORRHAGE AND PROMINENT LYMPHOID AGGREGATES.  - NEGATIVE FOR DYSPLASIA AND MALIGNANCY.   F. COLON, DESCENDING AND SIGMOID; COLD BIOPSY:  - MUCOSAL HEMORRHAGE.  - NEGATIVE FOR DYSPLASIA AND MALIGNANCY.   G. RECTUM; COLD BIOPSY:  - MODERATE ACTIVE COLITIS/PROCTITIS WITH ARCHITECTURAL FEATURES OF  CHRONICITY.  - NEGATIVE FOR DYSPLASIA AND MALIGNANCY.   Note: Sections of rectum show active colitis in form of cryptitis and  crypt abscess formation. Architectural features of chronicity are  present and include basal plasmacytosis, crypt dropout and crypt  irregularities. The differential diagnosis includes infectious colitis,  ischemia, drug (NSAIDs vs other), and idiopathic inflammatory bowel  disease (favored given clinical impression). Clinical correlation is  required.   ADDENDUM:  Sections of part C (appendiceal orifice) are re-reviewed at the  clinician's request. Mild crypt disarray and focal drop out is seen,  suggestive of chronicity. Negative for infectious agents. Clinical  correlation is required regarding if the location sampled represents an  area of acute appendicitis. In conjunction with findings in the rectum,  the differential diagnosis includes infectious  colitis, ischemia, drug  (NSAIDs vs other), biopsy adjacent to an inflamed diverticulum, and  early idiopathic inflammatory bowel disease. Clinical correlation is  required.           Current Outpatient Medications  Medication Sig Dispense Refill  . fexofenadine (ALLEGRA) 180 MG tablet Take 180 mg by mouth daily.    . hydrochlorothiazide (MICROZIDE) 12.5 MG capsule Take 12.5 mg by mouth daily.    . mesalamine (CANASA) 1000 MG suppository Place 1 suppository (1,000 mg total) rectally at bedtime. 30 suppository 2  . Mesalamine 800 MG TBEC Take 1 tablet (800 mg total) by mouth 3 (three) times daily. 90 tablet 2  . naproxen (NAPROSYN) 500 MG tablet Take 1 tablet (500 mg total) by mouth 2 (two) times daily with a meal. (Patient not taking: Reported on 01/16/2017) 20 tablet 00   No current facility-administered medications for this visit.     Allergies as of 11/28/2017  . (No Known Allergies)    ROS:  General: Negative for anorexia, weight loss, fever, chills, fatigue, weakness. ENT: Negative for hoarseness, difficulty swallowing , nasal congestion. CV: Negative for chest pain, angina, palpitations, dyspnea on exertion, peripheral edema.  Respiratory: Negative for dyspnea at rest, dyspnea on exertion, cough, sputum, wheezing.  GI: See history of present illness. GU:  Negative for dysuria, hematuria, urinary incontinence, urinary frequency, nocturnal urination.  Endo: Negative for unusual weight change.    Physical Examination:   BP 122/72   Pulse 69   Wt 139 lb (63 kg)   BMI 19.39 kg/m   General: Well-nourished, well-developed in no acute distress.  Eyes: No icterus. Conjunctivae pink. Mouth: Oropharyngeal mucosa moist and pink , no lesions erythema or exudate.  Neck: Supple, Trachea midline Abdomen: Bowel sounds are normal, nontender, nondistended, no hepatosplenomegaly or masses, no abdominal bruits or hernia , no rebound or guarding.   Extremities: No lower extremity  edema. No clubbing or deformities. Neuro: Alert and oriented x 3.  Grossly intact. Skin: Warm and dry, no jaundice.   Psych: Alert and cooperative, normal mood and affect.   Labs: CMP     Component Value Date/Time   NA 144 10/24/2017 1441   NA 133 (L) 07/05/2013 0747   K 4.0 10/24/2017 1441   K 3.8 07/05/2013 0747   CL 109 10/24/2017 1441   CL 100 07/05/2013 0747   CO2 28 10/24/2017 1441   CO2 32 07/05/2013 0747   GLUCOSE 70 10/24/2017 1441   GLUCOSE 94 07/05/2013 0747   BUN 17 10/24/2017 1441   BUN 10 07/05/2013 0747   CREATININE 0.77 10/24/2017 1441   CREATININE 1.05 07/05/2013 0747   CALCIUM 9.4 10/24/2017 1441   CALCIUM 9.4 07/05/2013 0747   PROT 7.5 10/24/2017 1441   ALBUMIN 4.3 10/24/2017 1441   AST 18 10/24/2017 1441   ALT 8 (L) 10/24/2017 1441   ALKPHOS 47 10/24/2017 1441   BILITOT 0.5 10/24/2017 1441   GFRNONAA >60 10/24/2017 1441   GFRNONAA >60 07/05/2013 0747   GFRAA >60 10/24/2017 1441   GFRAA >60 07/05/2013 0747   Lab Results  Component Value Date   WBC 5.2 11/08/2017   HGB 12.7 (L) 11/08/2017   HCT 41.2 11/08/2017   MCV 74 (L) 11/08/2017   PLT 234 11/08/2017    Imaging Studies: No results found.  Assessment and Plan:   Cameron Payne is a 42 y.o. 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 Mesalamine 1 g suppository every night, and oral 800 mg 3 times daily at this time.  Due to his developmental disability, his history is not entirely reliable, therefore we will have to rely on labs, and endoscopic findings going forward as well. He did complain of bright red blood per rectum to his mother , which led to his initial clinic visit.  His mother also reports  noting weight loss as well.  Patient is conversive, and alert and oriented, and is able to state that he does not have any current symptoms at this time which is reassuring.  The significance of ulcerative colitis/proctitis, management plan, potential complications, adverse effects of mesalamine, were discussed in detail with patient and family, and they are agreeable with the plan.  We will repeat BMP, in 1 to 2 weeks after starting mesalamine We will also obtain testing for hepatitis B immunity, and hepatitis A immunity as well We will also check TPMT, quantiferon gold testing, to prepare her the future if step up therapy is needed  No joint revision symptoms, or extraintestinal manifestations at this time  Patient family asked to ensure he is up-to-date on his vaccination.  If hepatitis a and B does not show immunity, will advise him to have this done as well.   Dr Cameron Payne

## 2017-12-05 ENCOUNTER — Ambulatory Visit: Payer: Medicare Other | Admitting: Gastroenterology

## 2017-12-07 ENCOUNTER — Telehealth: Payer: Self-pay | Admitting: Gastroenterology

## 2017-12-07 NOTE — Telephone Encounter (Signed)
LVM for patient to call and reschedule his 8/13 appt with Dr. Bonna Gains

## 2018-01-09 ENCOUNTER — Ambulatory Visit: Payer: Medicare Other | Admitting: Gastroenterology

## 2018-01-15 ENCOUNTER — Other Ambulatory Visit
Admission: RE | Admit: 2018-01-15 | Discharge: 2018-01-15 | Disposition: A | Discharge status: Home / Self Care | Payer: Medicare Other | Source: Ambulatory Visit | Attending: Gastroenterology | Admitting: Gastroenterology

## 2018-01-15 ENCOUNTER — Ambulatory Visit (INDEPENDENT_AMBULATORY_CARE_PROVIDER_SITE_OTHER): Payer: Medicare Other | Admitting: Gastroenterology

## 2018-01-15 ENCOUNTER — Encounter: Payer: Self-pay | Admitting: Gastroenterology

## 2018-01-15 VITALS — BP 137/87 | HR 72 | Ht 71.0 in | Wt 140.6 lb

## 2018-01-15 DIAGNOSIS — K5180 Other ulcerative colitis without complications: Secondary | ICD-10-CM | POA: Diagnosis not present

## 2018-01-15 DIAGNOSIS — K5190 Ulcerative colitis, unspecified, without complications: Secondary | ICD-10-CM | POA: Insufficient documentation

## 2018-01-15 DIAGNOSIS — K51319 Ulcerative (chronic) rectosigmoiditis with unspecified complications: Secondary | ICD-10-CM | POA: Diagnosis not present

## 2018-01-15 DIAGNOSIS — K5120 Ulcerative (chronic) proctitis without complications: Secondary | ICD-10-CM | POA: Diagnosis present

## 2018-01-15 LAB — COMPREHENSIVE METABOLIC PANEL
ALBUMIN: 4.8 g/dL (ref 3.5–5.0)
ALT: 12 U/L (ref 0–44)
AST: 18 U/L (ref 15–41)
Alkaline Phosphatase: 48 U/L (ref 38–126)
Anion gap: 9 (ref 5–15)
BILIRUBIN TOTAL: 0.7 mg/dL (ref 0.3–1.2)
BUN: 17 mg/dL (ref 6–20)
CHLORIDE: 103 mmol/L (ref 98–111)
CO2: 30 mmol/L (ref 22–32)
Calcium: 9.1 mg/dL (ref 8.9–10.3)
Creatinine, Ser: 0.89 mg/dL (ref 0.61–1.24)
GFR calc Af Amer: >60 mL/min (ref >60)
Glucose, Bld: 90 mg/dL (ref 70–99)
POTASSIUM: 4.1 mmol/L (ref 3.5–5.1)
Sodium: 142 mmol/L (ref 135–145)
TOTAL PROTEIN: 7.9 g/dL (ref 6.5–8.1)

## 2018-01-15 LAB — CBC
HCT: 39.7 % — ABNORMAL LOW (ref 40.0–52.0)
Hemoglobin: 12.6 g/dL — ABNORMAL LOW (ref 13.0–18.0)
MCH: 23 pg — ABNORMAL LOW (ref 26.0–34.0)
MCHC: 31.6 g/dL — ABNORMAL LOW (ref 32.0–36.0)
MCV: 72.7 fL — ABNORMAL LOW (ref 80.0–100.0)
Platelets: 184 10*3/uL (ref 150–440)
RBC: 5.47 MIL/uL (ref 4.40–5.90)
RDW: 13.8 % (ref 11.5–14.5)
WBC: 5.2 10*3/uL (ref 3.8–10.6)

## 2018-01-15 NOTE — Progress Notes (Signed)
[name], MD [address]  [place]  [place],  [address]  Main: [phone]  Fax: [phone]   Primary Care Physician: Elba Barman, MD  Primary Gastroenterologist:  Dr. [name]  Chief Complaint  Patient presents with  . 3 month follow up proctitis    HPI: Cameron Payne is a 42 y.o. male here for follow-up of ulcerative colitis.  On mesalamine 800 mg 3 times a day, and mesalamine 1000 mg suppository at bedtime daily.  Reports 2-3 formed problems daily.  No blood in stool.  No fever or chills, good appetite.  No weight loss.  No extraintestinal manifestations.  Initially seen in clinic: November 01, 2017  Presenting symptom in clinic: Intermittent bright red blood per rectum.  Mother noted weight loss at home. Pertinent initial lab findings: Fecal calprotectin mildly elevated at 126  Index colonoscopy: November 22, 2017 Colonoscopy findings: Impression: - The examination was suspicious for rectal ulcerative  colitis ulcerative colitis. - The examined portion of the ileum was normal.  Biopsied. - Erythematous mucosa at the appendiceal orifice.  Biopsied. - Patchy mild mucosal changes were found in the rectum,  rule out ulcerative colitis. Biopsied. - The sigmoid colon, descending colon, transverse  colon, ascending colon and cecum are normal. Biopsied. - The distal rectum and anal verge are normal on  retroflexion view.  EGD: November 22, 2017 Impression: - White nummular lesions in esophageal mucosa.  Brushings performed. - Normal stomach. - Normal duodenal bulb, second portion of the duodenum    and examined duodenum. Biopsied.   Surgical Pathology    DIAGNOSIS:  A. DUODENUM, SECOND PORTION END BULB; COLD BIOPSY:  - DUODENAL MUCOSA WITH PRESERVED VILLOUS ARCHITECTURE AND FOCAL  BRUNNER'S GLAND HYPERPLASIA.  - NEGATIVE FOR INTRA-EPITHELIAL LYMPHOCYTOSIS, DYSPLASIA AND MALIGNANCY.   B. TERMINAL ILEUM; COLD BIOPSY:  - SMALL BOWEL MUCOSA WITH INTACT VILLOUS ARCHITECTURE.  - NEGATIVE FOR INTRAEPITHELIAL LYMPHOCYTOSIS, DYSPLASIA AND MALIGNANCY.   C. COLON, APPENDICEAL ORIFICE; COLD BIOPSY:  - MILD ACTIVE COLITIS.  - NEGATIVE FOR DYSPLASIA AND MALIGNANCY.   D. COLON, CECUM AND ASCENDING; COLD BIOPSY:  - MUCOSAL EDEMA AND PROMINENT LYMPHOID AGGREGATES.  - NEGATIVE FOR DYSPLASIA AND MALIGNANCY.   E. TRANSVERSE COLON; COLD BIOPSY:  - MUCOSAL HEMORRHAGE AND PROMINENT LYMPHOID AGGREGATES.  - NEGATIVE FOR DYSPLASIA AND MALIGNANCY.   F. COLON, DESCENDING AND SIGMOID; COLD BIOPSY:  - MUCOSAL HEMORRHAGE.  - NEGATIVE FOR DYSPLASIA AND MALIGNANCY.   G. RECTUM; COLD BIOPSY:  - MODERATE ACTIVE COLITIS/PROCTITIS WITH ARCHITECTURAL FEATURES OF  CHRONICITY.  - NEGATIVE FOR DYSPLASIA AND MALIGNANCY.   Note: Sections of rectum show active colitis in form of cryptitis and  crypt abscess formation. Architectural features of chronicity are  present and include basal plasmacytosis, crypt dropout and crypt  irregularities. The differential diagnosis includes infectious colitis,  ischemia, drug (NSAIDs vs other), and idiopathic inflammatory bowel  disease (favored given clinical impression). Clinical correlation is  required.   ADDENDUM:  Sections of part C (appendiceal orifice) are re-reviewed at the  clinician's request. Mild crypt disarray and focal drop out is seen,  suggestive of chronicity. Negative for infectious agents. Clinical  correlation is required regarding if the location sampled represents an  area of acute appendicitis. In conjunction  with findings in the rectum,  the differential diagnosis includes infectious colitis, ischemia, drug  (NSAIDs vs other), biopsy adjacent to an inflamed diverticulum, and  early idiopathic inflammatory bowel disease. Clinical correlation is  required.  Current Outpatient Medications  Medication Sig Dispense Refill  . fexofenadine (ALLEGRA) 180 MG tablet Take 180 mg by mouth daily.    . hydrochlorothiazide (MICROZIDE) 12.5 MG capsule Take 12.5 mg by mouth daily.    . mesalamine (CANASA) 1000 MG suppository Place 1 suppository (1,000 mg total) rectally at bedtime. 30 suppository 2  . Mesalamine 800 MG TBEC Take 1 tablet (800 mg total) by mouth 3 (three) times daily. 90 tablet 2   No current facility-administered medications for this visit.     Allergies as of 01/15/2018  . (No Known Allergies)    ROS:  General: Negative for anorexia, weight loss, fever, chills, fatigue, weakness. ENT: Negative for hoarseness, difficulty swallowing , nasal congestion. CV: Negative for chest pain, angina, palpitations, dyspnea on exertion, peripheral edema.  Respiratory: Negative for dyspnea at rest, dyspnea on exertion, cough, sputum, wheezing.  GI: See history of present illness. GU:  Negative for dysuria, hematuria, urinary incontinence, urinary frequency, nocturnal urination.  Endo: Negative for unusual weight change.    Physical Examination:   BP 137/87   Pulse 72   Ht 5' 11"  (1.803 m)   Wt 140 lb 9.6 oz (63.8 kg)   BMI 19.61 kg/m   General: Well-nourished, well-developed in no acute distress.  Eyes: No icterus. Conjunctivae pink. Mouth: Oropharyngeal mucosa moist and pink , no lesions erythema or exudate. Neck: Supple, Trachea midline Abdomen: Bowel sounds are normal, nontender, nondistended, no hepatosplenomegaly or masses, no abdominal bruits or hernia , no rebound or guarding.   Extremities: No lower extremity edema. No clubbing or deformities. Neuro: Alert and oriented x 3.   Grossly intact. Skin: Warm and dry, no jaundice.   Psych: Alert and cooperative, normal mood and affect.   Labs: CMP     Component Value Date/Time   NA 144 10/24/2017 1441   NA 133 (L) 07/05/2013 0747   K 4.0 10/24/2017 1441   K 3.8 07/05/2013 0747   CL 109 10/24/2017 1441   CL 100 07/05/2013 0747   CO2 28 10/24/2017 1441   CO2 32 07/05/2013 0747   GLUCOSE 70 10/24/2017 1441   GLUCOSE 94 07/05/2013 0747   BUN 17 10/24/2017 1441   BUN 10 07/05/2013 0747   CREATININE 0.77 10/24/2017 1441   CREATININE 1.05 07/05/2013 0747   CALCIUM 9.4 10/24/2017 1441   CALCIUM 9.4 07/05/2013 0747   PROT 7.5 10/24/2017 1441   ALBUMIN 4.3 10/24/2017 1441   AST 18 10/24/2017 1441   ALT 8 (L) 10/24/2017 1441   ALKPHOS 47 10/24/2017 1441   BILITOT 0.5 10/24/2017 1441   GFRNONAA >60 10/24/2017 1441   GFRNONAA >60 07/05/2013 0747   GFRAA >60 10/24/2017 1441   GFRAA >60 07/05/2013 0747   Lab Results  Component Value Date   WBC 5.2 11/08/2017   HGB 12.7 (L) 11/08/2017   HCT 41.2 11/08/2017   MCV 74 (L) 11/08/2017   PLT 234 11/08/2017    Imaging Studies: No results found.  Assessment and Plan:   Cameron Payne is a 42 y.o. 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  Continue mesalamine 800 mg oral 3 times daily Continue mesalamine suppository 1000 mg at bedtime On last visit, labs were ordered but patient has not had this done We have asked him to go to the lab today to get these labs done, to check renal function since starting mesalamine Lab work is also to check hep B and hep A  immunity And also to obtain T PMT, QuantiFERON gold testing, for the future if needed No extraintestinal manifestations present  Currently clinically improved, continue current doses Can repeat colonoscopy in 3 to 4 months to assess for endoscopic and histologic remission  Patient's initial fecal calprotectin test cost $700 as per the patient's  mother, and she was not happy with this.  Since clinically he is improving, will not reorder this test at this time.  His CRP was normal at initial diagnosis.  Dr [name]

## 2018-01-16 LAB — HCV COMMENT:

## 2018-01-16 LAB — HEPATITIS C ANTIBODY (REFLEX): HCV Ab: <0.1 s/co ratio (ref 0.0–0.9)

## 2018-01-16 LAB — HEPATITIS B SURFACE ANTIGEN: HEP B S AG: NEGATIVE

## 2018-01-16 LAB — HEPATITIS B SURFACE ANTIBODY,QUALITATIVE: Hep B S Ab: NONREACTIVE

## 2018-01-16 LAB — HEPATITIS B CORE ANTIBODY, TOTAL: Hep B Core Total Ab: NEGATIVE

## 2018-01-16 LAB — HEPATITIS A ANTIBODY, TOTAL: HEP A TOTAL AB: NEGATIVE

## 2018-01-22 LAB — QUANTIFERON-TB GOLD PLUS (RQFGPL)
QUANTIFERON TB1 AG VALUE: 0.03 [IU]/mL
QUANTIFERON TB2 AG VALUE: 0.02 [IU]/mL
QuantiFERON Mitogen Value: >10 IU/mL
QuantiFERON Nil Value: 0.02 IU/mL

## 2018-01-22 LAB — QUANTIFERON-TB GOLD PLUS: QuantiFERON-TB Gold Plus: NEGATIVE

## 2018-01-22 LAB — THIOPURINE METHYLTRANSFERASE (TPMT), RBC: TPMT ACTIVITY: 17.7 U/mL{RBCs}

## 2018-02-12 ENCOUNTER — Ambulatory Visit: Payer: Medicare Other | Admitting: Gastroenterology

## 2018-02-16 ENCOUNTER — Telehealth: Payer: Self-pay

## 2018-02-16 DIAGNOSIS — D509 Iron deficiency anemia, unspecified: Secondary | ICD-10-CM

## 2018-02-16 NOTE — Telephone Encounter (Signed)
-----   Message from Virgel Manifold, MD sent at 01/16/2018  1:11 PM EDT ----- Jackelyn Poling please let patient know, his kidney function looks good.  Please refer him to hematology for microcytic anemia.  Tell them this is not anything concerning, but needs to be evaluated by hematology.

## 2018-02-16 NOTE — Telephone Encounter (Signed)
Patients mother-Wanda has been informed her sons kidney function looks good.  We will send a referral to hematology for microcytic anemia-and that this is not anything concerning.  Thanks [employer]

## 2018-02-26 ENCOUNTER — Inpatient Hospital Stay: Payer: Medicare Other

## 2018-02-26 ENCOUNTER — Encounter: Payer: Self-pay | Admitting: Oncology

## 2018-02-26 ENCOUNTER — Inpatient Hospital Stay: Payer: Medicare Other | Attending: Oncology | Admitting: Oncology

## 2018-02-26 ENCOUNTER — Other Ambulatory Visit: Payer: Self-pay

## 2018-02-26 ENCOUNTER — Encounter (INDEPENDENT_AMBULATORY_CARE_PROVIDER_SITE_OTHER): Payer: Self-pay

## 2018-02-26 VITALS — BP 129/88 | HR 74 | Temp 96.7°F | Resp 18 | Ht 71.0 in | Wt 139.9 lb

## 2018-02-26 DIAGNOSIS — Z79899 Other long term (current) drug therapy: Secondary | ICD-10-CM | POA: Insufficient documentation

## 2018-02-26 DIAGNOSIS — I10 Essential (primary) hypertension: Secondary | ICD-10-CM | POA: Diagnosis not present

## 2018-02-26 DIAGNOSIS — K51319 Ulcerative (chronic) rectosigmoiditis with unspecified complications: Secondary | ICD-10-CM | POA: Diagnosis not present

## 2018-02-26 DIAGNOSIS — Z8249 Family history of ischemic heart disease and other diseases of the circulatory system: Secondary | ICD-10-CM | POA: Insufficient documentation

## 2018-02-26 DIAGNOSIS — F17290 Nicotine dependence, other tobacco product, uncomplicated: Secondary | ICD-10-CM | POA: Diagnosis not present

## 2018-02-26 DIAGNOSIS — D509 Iron deficiency anemia, unspecified: Secondary | ICD-10-CM

## 2018-02-26 LAB — IRON AND TIBC
IRON: 93 ug/dL (ref 45–182)
SATURATION RATIOS: 35 % (ref 17.9–39.5)
TIBC: 269 ug/dL (ref 250–450)
UIBC: 176 ug/dL

## 2018-02-26 LAB — CBC WITH DIFFERENTIAL/PLATELET
BASOS ABS: 0 10*3/uL (ref 0–0.1)
Basophils Relative: 1 %
EOS ABS: 0.1 10*3/uL (ref 0–0.7)
EOS PCT: 1 %
HCT: 41.1 % (ref 40.0–52.0)
Hemoglobin: 12.8 g/dL — ABNORMAL LOW (ref 13.0–18.0)
LYMPHS PCT: 31 %
Lymphs Abs: 1.9 10*3/uL (ref 1.0–3.6)
MCH: 22.8 pg — ABNORMAL LOW (ref 26.0–34.0)
MCHC: 31.1 g/dL — ABNORMAL LOW (ref 32.0–36.0)
MCV: 73.3 fL — AB (ref 80.0–100.0)
MONO ABS: 0.4 10*3/uL (ref 0.2–1.0)
Monocytes Relative: 7 %
Neutro Abs: 3.7 10*3/uL (ref 1.4–6.5)
Neutrophils Relative %: 60 %
PLATELETS: 203 10*3/uL (ref 150–440)
RBC: 5.6 MIL/uL (ref 4.40–5.90)
RDW: 13.7 % (ref 11.5–14.5)
WBC: 6.2 10*3/uL (ref 3.8–10.6)

## 2018-02-26 LAB — FERRITIN: FERRITIN: 270 ng/mL (ref 24–336)

## 2018-02-26 NOTE — Progress Notes (Signed)
Patient here for initial visit. Mother present with patient.

## 2018-02-26 NOTE — Progress Notes (Signed)
Hematology/Oncology Consult note [facility] [phone] [phone]   Patient Care Team: Elba Barman, MD as PCP - General (Family Medicine)  REFERRING PROVIDER: Dr.Tahiliani Margretta Sidle CHIEF COMPLAINTS/REASON FOR VISIT:  Evaluation of microcytic anemia  HISTORY OF PRESENTING ILLNESS:  Cameron Payne is a  42 y.o.  male with PMH listed below who was referred to me for evaluation of microcytic anemia. Patient was diagnosed with ulcerative colitis after presenting with bloody stool in June 2019.  He has establish care with [place] gastro[facility]ology Dr. Bonna Gains.  Colonoscopy in June 2016 2019 showed suspicion examination for rectal ulcerative colitis.  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 Patient currently is on mesalamine 800 mg 3 times daily, mesalamine suppository 1000 mg at bedtime. Reports that no more additional episodes of bloody stool.  Denies any abdominal pain or black stool. 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 Iron panel was all obtained on 11/08/2017 which showed iron level 67, TIBC 339, ferritin 504.  Patient was referred to me for additional work-up for his microcytic anemia. Patient denies any nausea vomiting, abdominal pain, chest pain, shortness of breath, weight loss.    Review of Systems  Constitutional: Negative for chills, fever, malaise/fatigue and weight loss.  HENT: Negative for nosebleeds and sore throat.   Eyes: Negative for double vision, photophobia and redness.  Respiratory: Negative for cough, shortness of breath and wheezing.   Cardiovascular: Negative for chest pain, palpitations and orthopnea.  Gastrointestinal: Negative for abdominal pain, blood in stool, nausea and vomiting.  Genitourinary: Negative for dysuria.    Musculoskeletal: Negative for back pain, myalgias and neck pain.  Skin: Negative for itching and rash.  Neurological: Negative for dizziness, tingling and tremors.  Endo/Heme/Allergies: Negative for environmental allergies. Does not bruise/bleed easily.  Psychiatric/Behavioral: Negative for depression.    MEDICAL HISTORY:  Past Medical History:  Diagnosis Date  . Developmental disability   . Hypertension   . Seizures ([place])    as a child    SURGICAL HISTORY: Past Surgical History:  Procedure Laterality Date  . COLONOSCOPY WITH PROPOFOL N/A 11/22/2017   Procedure: COLONOSCOPY WITH PROPOFOL;  Surgeon: Virgel Manifold, MD;  Location: [facility] ENDOSCOPY;  Service: Endoscopy;  Laterality: N/A;  . ESOPHAGOGASTRODUODENOSCOPY (EGD) WITH PROPOFOL N/A 11/22/2017   Procedure: ESOPHAGOGASTRODUODENOSCOPY (EGD) WITH PROPOFOL;  Surgeon: Virgel Manifold, MD;  Location: [facility] ENDOSCOPY;  Service: Endoscopy;  Laterality: N/A;    SOCIAL HISTORY: Social History   Socioeconomic History  . Marital status: Single    Spouse name: Not on file  . Number of children: Not on file  . Years of education: Not on file  . Highest education level: Not on file  Occupational History  . Not on file  Social Needs  . Financial resource strain: Not on file  . Food insecurity:    Worry: Not on file    Inability: Not on file  . Transportation needs:    Medical: Not on file    Non-medical: Not on file  Tobacco Use  . Smoking status: Light Tobacco Smoker    Years: 25.00    Types: Cigars  . Smokeless tobacco: Never Used  . Tobacco comment: occ.   Substance and Sexual Activity  . Alcohol use: Yes    Alcohol/week: 1.0 standard drinks    Types: 1 Cans of beer per week    Comment: occ.  Marland Kitchen  Drug use: Never  . Sexual activity: Not on file  Lifestyle  . Physical activity:    Days per week: Not on file    Minutes per session: Not on file  . Stress: Not on file  Relationships  . Social connections:     Talks on phone: Not on file    Gets together: Not on file    Attends religious service: Not on file    Active member of club or organization: Not on file    Attends meetings of clubs or organizations: Not on file    Relationship status: Not on file  . Intimate partner violence:    Fear of current or ex partner: Not on file    Emotionally abused: Not on file    Physically abused: Not on file    Forced sexual activity: Not on file  Other Topics Concern  . Not on file  Social History Narrative  . Not on file    FAMILY HISTORY: Family History  Problem Relation Age of Onset  . Diabetes Mother   . Heart failure Mother   . Cancer Father        liver  . Cancer Paternal Grandfather        does not know what kind  . Cancer Maternal Aunt        breast (half sister) pt mother was adopted  . Colon cancer Neg Hx   . Thyroid disease Neg Hx     ALLERGIES:  has No Known Allergies.  MEDICATIONS:  Current Outpatient Medications  Medication Sig Dispense Refill  . fexofenadine (ALLEGRA) 180 MG tablet Take 180 mg by mouth daily.    . mesalamine (CANASA) 1000 MG suppository Place 1,000 mg rectally at bedtime.     . Mesalamine 800 MG TBEC TK 1 T PO TID  1  . hydrochlorothiazide (MICROZIDE) 12.5 MG capsule Take 12.5 mg by mouth daily.    . mesalamine (CANASA) 1000 MG suppository Place 1 suppository (1,000 mg total) rectally at bedtime. 30 suppository 2  . Mesalamine 800 MG TBEC Take 1 tablet (800 mg total) by mouth 3 (three) times daily. 90 tablet 2   No current facility-administered medications for this visit.      PHYSICAL EXAMINATION: ECOG PERFORMANCE STATUS: 0 - Asymptomatic Vitals:   02/26/18 0946  BP: 129/88  Pulse: 74  Resp: 18  Temp: (!) 96.7 F (35.9 C)   Filed Weights   02/26/18 0946  Weight: 139 lb 14.4 oz (63.5 kg)    Physical Exam  Constitutional: He is oriented to person, place, and time. No distress.  HENT:  Head: Normocephalic and atraumatic.  Mouth/Throat:  Oropharynx is clear and moist.  Eyes: Pupils are equal, round, and reactive to light. EOM are normal. No scleral icterus.  Neck: Normal range of motion. Neck supple.  Cardiovascular: Normal rate, regular rhythm and normal heart sounds.  Pulmonary/Chest: Effort normal. No respiratory distress. He has no wheezes.  Abdominal: Soft. Bowel sounds are normal. He exhibits no distension and no mass. There is no tenderness.  Musculoskeletal: Normal range of motion. He exhibits no edema or deformity.  Neurological: He is alert and oriented to person, place, and time. No cranial nerve deficit. Coordination normal.  Skin: Skin is warm and dry. No rash noted. No erythema.  Psychiatric: He has a normal mood and affect. His behavior is normal. Thought content normal.     LABORATORY DATA:  I have reviewed the data as listed Lab Results  Component  Value Date   WBC 6.2 02/26/2018   HGB 12.8 (L) 02/26/2018   HCT 41.1 02/26/2018   MCV 73.3 (L) 02/26/2018   PLT 203 02/26/2018   Recent Labs    10/24/17 1441 01/15/18 1213  NA 144 142  K 4.0 4.1  CL 109 103  CO2 28 30  GLUCOSE 70 90  BUN 17 17  CREATININE 0.77 0.89  CALCIUM 9.4 9.1  GFRNONAA >60 >60  GFRAA >60 >60  PROT 7.5 7.9  ALBUMIN 4.3 4.8  AST 18 18  ALT 8* 12  ALKPHOS 47 48  BILITOT 0.5 0.7   Iron/TIBC/Ferritin/ %Sat    Component Value Date/Time   IRON 67 11/08/2017 1113   TIBC 239 (L) 11/08/2017 1113   FERRITIN 504 (H) 11/08/2017 1113   IRONPCTSAT 28 11/08/2017 1113        ASSESSMENT & PLAN:  1. Microcytic anemia   2. Ulcerative rectosigmoiditis with complication (HCC)    Anemia:  Discussed with patient that her initial iron study not consistent with iron deficiency.  He seems to have chronic microcytic anemia which can be seen in iron deficiency anemia, or chronic hemoglobinopathies. I will repeat CBC, smear, iron/TIBC, ferritin, hemoglobin neuropathy evaluation. His initial iron deficiency showed high ferritin which  can be elevated due to acute inflammatory status when he was diagnosed with ulcerative colitis.  In addition to repeating iron panel, I will also send soluble transferrin receptor.  Orders Placed This Encounter  Procedures  . CBC with Differential/Platelet    Standing Status:   Future    Number of Occurrences:   1    Standing Expiration Date:   02/27/2019  . Iron and TIBC    Standing Status:   Future    Number of Occurrences:   1    Standing Expiration Date:   02/27/2019  . Ferritin    Standing Status:   Future    Number of Occurrences:   1    Standing Expiration Date:   02/27/2019  . Hemoglobinopathy evaluation    Standing Status:   Future    Number of Occurrences:   1    Standing Expiration Date:   02/27/2019  . Miscellaneous [facility] test (send-out)    Standing Status:   Future    Number of Occurrences:   1    Standing Expiration Date:   02/26/2019    Order Specific Question:   Test name / description:    Answer:   soluble transferin receptor  We spent sufficient time to discuss many aspect of care, questions were answered to patient's satisfaction. The patient knows to call the clinic with any problems questions or concerns.  Return of visit: 2 weeks Thank you for this kind referral and the opportunity to participate in the care of this patient. A copy of today's note is routed to referring provider  Total face to face encounter time for this patient visit was 45 min. >50% of the time was  spent in counseling and coordination of care.    Earlie Server, MD, PhD Hematology Oncology [facility] Pager- [phone] 02/26/2018

## 2018-02-27 LAB — HEMOGLOBINOPATHY EVALUATION
HGB C: 0 %
Hgb A2 Quant: 2.3 % (ref 1.8–3.2)
Hgb A: 97.7 % (ref 96.4–98.8)
Hgb F Quant: 0 % (ref 0.0–2.0)
Hgb S Quant: 0 %
Hgb Variant: 0 %

## 2018-02-27 LAB — MISC LABCORP TEST (SEND OUT)
LabCorp test name: 143305
Labcorp test code: 143305

## 2018-03-12 ENCOUNTER — Encounter: Payer: Self-pay | Admitting: Oncology

## 2018-03-12 ENCOUNTER — Other Ambulatory Visit: Payer: Self-pay

## 2018-03-12 ENCOUNTER — Inpatient Hospital Stay: Payer: Medicare Other | Attending: Oncology | Admitting: Oncology

## 2018-03-12 VITALS — BP 120/84 | HR 64 | Temp 96.8°F | Resp 18 | Wt 145.1 lb

## 2018-03-12 DIAGNOSIS — Z79899 Other long term (current) drug therapy: Secondary | ICD-10-CM

## 2018-03-12 DIAGNOSIS — F17210 Nicotine dependence, cigarettes, uncomplicated: Secondary | ICD-10-CM | POA: Diagnosis not present

## 2018-03-12 DIAGNOSIS — D509 Iron deficiency anemia, unspecified: Secondary | ICD-10-CM | POA: Diagnosis present

## 2018-03-12 DIAGNOSIS — F17290 Nicotine dependence, other tobacco product, uncomplicated: Secondary | ICD-10-CM

## 2018-03-12 MED ORDER — FOLIC ACID 400 MCG PO TABS: 400.0000 ug | ORAL_TABLET | Freq: Every day | ORAL | 1 refills | Status: AC

## 2018-03-12 NOTE — Progress Notes (Signed)
Hematology/Oncology Consult note [facility] Telephone:([phone] [phone]   Patient Care Team: Elba Barman, MD as PCP - General (Family Medicine)  REFERRING PROVIDER: Dr.Tahiliani Margretta Sidle REASON FOR VISIT Follow up for treatment of microcytic anemia  HISTORY OF PRESENTING ILLNESS:  Cameron Payne is a  42 y.o.  male with PMH listed below who was referred to me for evaluation of microcytic anemia. Patient was diagnosed with ulcerative colitis after presenting with bloody stool in June 2019.  He has establish care with [facility] Dr. Bonna Gains.  Colonoscopy in June 2016 2019 showed suspicion examination for rectal ulcerative colitis.  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 Patient currently is on mesalamine 800 mg 3 times daily, mesalamine suppository 1000 mg at bedtime. Reports that no more additional episodes of bloody stool.  Denies any abdominal pain or black stool. 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 Iron panel was all obtained on 11/08/2017 which showed iron level 67, TIBC 339, ferritin 504.  Patient was referred to me for additional work-up for his microcytic anemia. Patient denies any nausea vomiting, abdominal pain, chest pain, shortness of breath, weight loss.   INTERVAL HISTORY Cameron Payne is a 42 y.o. male who has above history reviewed by me today presents for follow up visit for management of microcytic anemia Problems and complaints are listed below: During the interval he has had lab work-up done and present to discuss lab results.  Denies any new complaints.  Feeling well at baseline.  Review of Systems  Constitutional: Negative for chills, fever, malaise/fatigue and weight loss.  HENT: Negative for nosebleeds and sore  throat.   Eyes: Negative for double vision, photophobia and redness.  Respiratory: Negative for cough, shortness of breath and wheezing.   Cardiovascular: Negative for chest pain, palpitations and orthopnea.  Gastrointestinal: Negative for abdominal pain, blood in stool, nausea and vomiting.  Genitourinary: Negative for dysuria.  Musculoskeletal: Negative for back pain, myalgias and neck pain.  Skin: Negative for itching and rash.  Neurological: Negative for dizziness, tingling and tremors.  Endo/Heme/Allergies: Negative for environmental allergies. Does not bruise/bleed easily.  Psychiatric/Behavioral: Negative for depression.    MEDICAL HISTORY:  Past Medical History:  Diagnosis Date  . Developmental disability   . Hypertension   . Seizures ([place])    as a child    SURGICAL HISTORY: Past Surgical History:  Procedure Laterality Date  . COLONOSCOPY WITH PROPOFOL N/A 11/22/2017   Procedure: COLONOSCOPY WITH PROPOFOL;  Surgeon: Virgel Manifold, MD;  Location: [facility] ENDOSCOPY;  Service: Endoscopy;  Laterality: N/A;  . ESOPHAGOGASTRODUODENOSCOPY (EGD) WITH PROPOFOL N/A 11/22/2017   Procedure: ESOPHAGOGASTRODUODENOSCOPY (EGD) WITH PROPOFOL;  Surgeon: Virgel Manifold, MD;  Location: [facility] ENDOSCOPY;  Service: Endoscopy;  Laterality: N/A;    SOCIAL HISTORY: Social History   Socioeconomic History  . Marital status: Single    Spouse name: Not on file  . Number of children: Not on file  . Years of education: Not on file  . Highest education level: Not on file  Occupational History  . Not on file  Social Needs  . Financial resource strain: Not on file  . Food insecurity:    Worry: Not on file    Inability: Not on file  . Transportation needs:    Medical: Not on file    Non-medical: Not on file  Tobacco Use  .  Smoking status: Light Tobacco Smoker    Years: 25.00    Types: Cigars  . Smokeless tobacco: Never Used  . Tobacco comment: occ.   Substance and Sexual Activity    . Alcohol use: Yes    Alcohol/week: 1.0 standard drinks    Types: 1 Cans of beer per week    Comment: occ.  . Drug use: Never  . Sexual activity: Not on file  Lifestyle  . Physical activity:    Days per week: Not on file    Minutes per session: Not on file  . Stress: Not on file  Relationships  . Social connections:    Talks on phone: Not on file    Gets together: Not on file    Attends religious service: Not on file    Active member of club or organization: Not on file    Attends meetings of clubs or organizations: Not on file    Relationship status: Not on file  . Intimate partner violence:    Fear of current or ex partner: Not on file    Emotionally abused: Not on file    Physically abused: Not on file    Forced sexual activity: Not on file  Other Topics Concern  . Not on file  Social History Narrative  . Not on file    FAMILY HISTORY: Family History  Problem Relation Age of Onset  . Diabetes Mother   . Heart failure Mother   . Cancer Father        liver  . Cancer Paternal Grandfather        does not know what kind  . Cancer Maternal Aunt        breast (half sister) pt mother was adopted  . Colon cancer Neg Hx   . Thyroid disease Neg Hx     ALLERGIES:  has No Known Allergies.  MEDICATIONS:  Current Outpatient Medications  Medication Sig Dispense Refill  . fexofenadine (ALLEGRA) 180 MG tablet Take 180 mg by mouth daily.    . mesalamine (CANASA) 1000 MG suppository Place 1,000 mg rectally at bedtime.     . Mesalamine 800 MG TBEC TK 1 T PO TID  1  . folic acid (FOLVITE) 235 MCG tablet Take 1 tablet (400 mcg total) by mouth daily. 90 tablet 1  . hydrochlorothiazide (MICROZIDE) 12.5 MG capsule Take 12.5 mg by mouth daily.    . mesalamine (CANASA) 1000 MG suppository Place 1 suppository (1,000 mg total) rectally at bedtime. 30 suppository 2  . Mesalamine 800 MG TBEC Take 1 tablet (800 mg total) by mouth 3 (three) times daily. 90 tablet 2   No current  facility-administered medications for this visit.      PHYSICAL EXAMINATION: ECOG PERFORMANCE STATUS: 0 - Asymptomatic Vitals:   03/12/18 0938  BP: 120/84  Pulse: 64  Resp: 18  Temp: (!) 96.8 F (36 C)   Filed Weights   03/12/18 0938  Weight: 145 lb 1.6 oz (65.8 kg)    Physical Exam  Constitutional: He is oriented to person, place, and time. No distress.  HENT:  Head: Normocephalic and atraumatic.  Mouth/Throat: Oropharynx is clear and moist.  Eyes: Pupils are equal, round, and reactive to light. EOM are normal. No scleral icterus.  Neck: Normal range of motion. Neck supple.  Cardiovascular: Normal rate, regular rhythm and normal heart sounds.  Pulmonary/Chest: Effort normal. No respiratory distress. He has no wheezes.  Abdominal: Soft. Bowel sounds are normal. He exhibits no distension and  no mass. There is no tenderness.  Musculoskeletal: Normal range of motion. He exhibits no edema or deformity.  Neurological: He is alert and oriented to person, place, and time. No cranial nerve deficit. Coordination normal.  Skin: Skin is warm and dry. No rash noted. No erythema.  Psychiatric: He has a normal mood and affect. His behavior is normal. Thought content normal.     LABORATORY DATA:  I have reviewed the data as listed Lab Results  Component Value Date   WBC 6.2 02/26/2018   HGB 12.8 (L) 02/26/2018   HCT 41.1 02/26/2018   MCV 73.3 (L) 02/26/2018   PLT 203 02/26/2018   Recent Labs    10/24/17 1441 01/15/18 1213  NA 144 142  K 4.0 4.1  CL 109 103  CO2 28 30  GLUCOSE 70 90  BUN 17 17  CREATININE 0.77 0.89  CALCIUM 9.4 9.1  GFRNONAA >60 >60  GFRAA >60 >60  PROT 7.5 7.9  ALBUMIN 4.3 4.8  AST 18 18  ALT 8* 12  ALKPHOS 47 48  BILITOT 0.5 0.7   Iron/TIBC/Ferritin/ %Sat    Component Value Date/Time   IRON 93 02/26/2018 1032   IRON 67 11/08/2017 1113   TIBC 269 02/26/2018 1032   TIBC 239 (L) 11/08/2017 1113   FERRITIN 270 02/26/2018 1032   FERRITIN 504  (H) 11/08/2017 1113   IRONPCTSAT 35 02/26/2018 1032   IRONPCTSAT 28 11/08/2017 1113        ASSESSMENT & PLAN:  1. Microcytic anemia    Anemia:  Labs reviewed and discussed with patient.  Iron panel not consistent with iron deficiency.  We checked a soluble transferrin receptor level which was normal. Hemoglobinopathy evaluation showed normal adult hemoglobin presents. Given his clinical history of chronic anemia, microcytosis this is consistent with alpha thalassemia trait  To confirm the diagnosis, will check Alpha-Thalassema genotype.   Recommend patient to take folic acid 340 MCG oral daily. He does not need to take any iron supplementation. Mother has multiple questions.We spent sufficient time to discuss many aspect of care, questions were answered to patient's satisfaction.   Orders Placed This Encounter  Procedures  . CBC with Differential/Platelet    Standing Status:   Future    Standing Expiration Date:   06/13/2019  . Comprehensive metabolic panel    Standing Status:   Future    Standing Expiration Date:   06/13/2019  . Return of visit: 1 year Total face to face encounter time for this patient visit was 25 min. >50% of the time was  spent in counseling and coordination of care.   Earlie Server, MD, PhD Hematology Oncology [facility] Pager- [phone] 03/12/2018

## 2018-03-12 NOTE — Progress Notes (Signed)
Patient here for follow up. No concerns voiced.  °

## 2018-03-27 ENCOUNTER — Inpatient Hospital Stay: Payer: Medicare Other

## 2018-03-27 DIAGNOSIS — D509 Iron deficiency anemia, unspecified: Secondary | ICD-10-CM | POA: Diagnosis not present

## 2018-03-27 LAB — RETICULOCYTES
Immature Retic Fract: 8.3 % (ref 2.3–15.9)
RBC.: 5.54 MIL/uL (ref 4.22–5.81)
RETIC COUNT ABSOLUTE: 82 10*3/uL (ref 19.0–186.0)
RETIC CT PCT: 1.5 % (ref 0.4–3.1)

## 2018-04-11 LAB — ALPHA-THALASSEMIA GENOTYPR

## 2018-04-17 ENCOUNTER — Encounter: Payer: Self-pay | Admitting: Gastroenterology

## 2018-04-17 ENCOUNTER — Ambulatory Visit (INDEPENDENT_AMBULATORY_CARE_PROVIDER_SITE_OTHER): Payer: Medicare Other | Admitting: Gastroenterology

## 2018-04-17 VITALS — BP 137/70 | HR 78 | Ht 71.0 in | Wt 145.6 lb

## 2018-04-17 DIAGNOSIS — K51219 Ulcerative (chronic) proctitis with unspecified complications: Secondary | ICD-10-CM | POA: Diagnosis not present

## 2018-04-17 DIAGNOSIS — R748 Abnormal levels of other serum enzymes: Secondary | ICD-10-CM | POA: Diagnosis not present

## 2018-04-17 DIAGNOSIS — Z23 Encounter for immunization: Secondary | ICD-10-CM | POA: Diagnosis not present

## 2018-04-17 NOTE — Addendum Note (Signed)
Addended by: Earl Lagos on: 04/17/2018 05:04 PM   Modules accepted: Orders

## 2018-04-17 NOTE — Progress Notes (Signed)
[name], MD [address]  [place]  [place], [place] [address]  Main: [phone]  Fax: [phone]   Primary Care Physician: Elba Barman, MD  Primary Gastroenterologist:  Dr. [name]  Chief Complaint  Patient presents with  . Follow-up    ulcerative proctitis    HPI: Cameron Payne is a 42 y.o. 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  On mesalamine 800 mg 2 times a day mesalamine thousand grams suppository at bedtime.  Remains in clinical remission.  Reports one soft bowel movement daily.  No blood streaks in stool.  Reports good appetite.  No weight loss.  No fever or chills.  No extraintestinal manifestation.  Initially seen in clinic: November 01, 2017  Presenting symptom in clinic:Intermittent bright red blood per rectum. Mother noted weight loss at home. Pertinent initial lab findings: Fecal calprotectin mildly elevated at 126  Index colonoscopy: November 22, 2017 Colonoscopy findings: Impression: - The examination was suspicious for rectal ulcerative  colitis ulcerative colitis. - The examined portion of the ileum was normal.  Biopsied. - Erythematous mucosa at the appendiceal orifice.  Biopsied. - Patchy mild mucosal changes were found in the rectum,  rule out ulcerative colitis. Biopsied. - The sigmoid colon, descending colon, transverse  colon, ascending colon and cecum are normal. Biopsied. - The distal rectum and anal verge are normal on  retroflexion view.  EGD: November 22, 2017 Impression: - White nummular lesions in esophageal mucosa.  Brushings performed. - Normal stomach. -  Normal duodenal bulb, second portion of the duodenum  and examined duodenum. Biopsied.   Surgical Pathology    DIAGNOSIS:  A. DUODENUM, SECOND PORTION END BULB; COLD BIOPSY:  - DUODENAL MUCOSA WITH PRESERVED VILLOUS ARCHITECTURE AND FOCAL  BRUNNER'S GLAND HYPERPLASIA.  - NEGATIVE FOR INTRA-EPITHELIAL LYMPHOCYTOSIS, DYSPLASIA AND MALIGNANCY.   B. TERMINAL ILEUM; COLD BIOPSY:  - SMALL BOWEL MUCOSA WITH INTACT VILLOUS ARCHITECTURE.  - NEGATIVE FOR INTRAEPITHELIAL LYMPHOCYTOSIS, DYSPLASIA AND MALIGNANCY.   C. COLON, APPENDICEAL ORIFICE; COLD BIOPSY:  - MILD ACTIVE COLITIS.  - NEGATIVE FOR DYSPLASIA AND MALIGNANCY.   D. COLON, CECUM AND ASCENDING; COLD BIOPSY:  - MUCOSAL EDEMA AND PROMINENT LYMPHOID AGGREGATES.  - NEGATIVE FOR DYSPLASIA AND MALIGNANCY.   E. TRANSVERSE COLON; COLD BIOPSY:  - MUCOSAL HEMORRHAGE AND PROMINENT LYMPHOID AGGREGATES.  - NEGATIVE FOR DYSPLASIA AND MALIGNANCY.   F. COLON, DESCENDING AND SIGMOID; COLD BIOPSY:  - MUCOSAL HEMORRHAGE.  - NEGATIVE FOR DYSPLASIA AND MALIGNANCY.   G. RECTUM; COLD BIOPSY:  - MODERATE ACTIVE COLITIS/PROCTITIS WITH ARCHITECTURAL FEATURES OF  CHRONICITY.  - NEGATIVE FOR DYSPLASIA AND MALIGNANCY.   Note: Sections of rectum show active colitis in form of cryptitis and  crypt abscess formation. Architectural features of chronicity are  present and include basal plasmacytosis, crypt dropout and crypt  irregularities. The differential diagnosis includes infectious colitis,  ischemia, drug (NSAIDs vs other), and idiopathic inflammatory bowel  disease (favored given clinical impression). Clinical correlation is  required.   ADDENDUM:  Sections of part C (appendiceal orifice) are re-reviewed at the  clinician's request. Mild crypt disarray and focal drop out is seen,  suggestive of chronicity. Negative for infectious agents. Clinical  correlation is required regarding if the location sampled  represents an  area of acute appendicitis. In conjunction with findings in the rectum,  the differential diagnosis includes infectious colitis, ischemia, drug  (NSAIDs vs other), biopsy adjacent to an inflamed diverticulum, and  early idiopathic inflammatory bowel disease. Clinical  correlation is  required.        Current Outpatient Medications  Medication Sig Dispense Refill  . fexofenadine (ALLEGRA) 180 MG tablet Take 180 mg by mouth daily.    . mesalamine (CANASA) 1000 MG suppository Place 1,000 mg rectally at bedtime.     . Mesalamine 800 MG TBEC TK 1 T PO TID  1  . folic acid (FOLVITE) 810 MCG tablet Take 1 tablet (400 mcg total) by mouth daily. (Patient not taking: Reported on 04/17/2018) 90 tablet 1  . hydrochlorothiazide (MICROZIDE) 12.5 MG capsule Take 12.5 mg by mouth daily.     No current facility-administered medications for this visit.     Allergies as of 04/17/2018  . (No Known Allergies)    ROS:  General: Negative for anorexia, weight loss, fever, chills, fatigue, weakness. ENT: Negative for hoarseness, difficulty swallowing , nasal congestion. CV: Negative for chest pain, angina, palpitations, dyspnea on exertion, peripheral edema.  Respiratory: Negative for dyspnea at rest, dyspnea on exertion, cough, sputum, wheezing.  GI: See history of present illness. GU:  Negative for dysuria, hematuria, urinary incontinence, urinary frequency, nocturnal urination.  Endo: Negative for unusual weight change.    Physical Examination:   BP 137/70   Pulse 78   Ht 5' 11"  (1.803 m)   Wt 145 lb 9.6 oz (66 kg)   BMI 20.31 kg/m   General: Well-nourished, well-developed in no acute distress.  Eyes: No icterus. Conjunctivae pink. Mouth: Oropharyngeal mucosa moist and pink , no lesions erythema or exudate. Neck: Supple, Trachea midline Abdomen: Bowel sounds are normal, nontender, nondistended, no hepatosplenomegaly or masses, no abdominal bruits or hernia , no rebound or  guarding.   Extremities: No lower extremity edema. No clubbing or deformities. Neuro: Alert and oriented x 3.  Grossly intact. Skin: Warm and dry, no jaundice.   Psych: Alert and cooperative, normal mood and affect.   Labs: CMP     Component Value Date/Time   NA 142 01/15/2018 1213   NA 133 (L) 07/05/2013 0747   K 4.1 01/15/2018 1213   K 3.8 07/05/2013 0747   CL 103 01/15/2018 1213   CL 100 07/05/2013 0747   CO2 30 01/15/2018 1213   CO2 32 07/05/2013 0747   GLUCOSE 90 01/15/2018 1213   GLUCOSE 94 07/05/2013 0747   BUN 17 01/15/2018 1213   BUN 10 07/05/2013 0747   CREATININE 0.89 01/15/2018 1213   CREATININE 1.05 07/05/2013 0747   CALCIUM 9.1 01/15/2018 1213   CALCIUM 9.4 07/05/2013 0747   PROT 7.9 01/15/2018 1213   ALBUMIN 4.8 01/15/2018 1213   AST 18 01/15/2018 1213   ALT 12 01/15/2018 1213   ALKPHOS 48 01/15/2018 1213   BILITOT 0.7 01/15/2018 1213   GFRNONAA >60 01/15/2018 1213   GFRNONAA >60 07/05/2013 0747   GFRAA >60 01/15/2018 1213   GFRAA >60 07/05/2013 0747   Lab Results  Component Value Date   WBC 6.2 02/26/2018   HGB 12.8 (L) 02/26/2018   HCT 41.1 02/26/2018   MCV 73.3 (L) 02/26/2018   PLT 203 02/26/2018    Imaging Studies: No results found.  Assessment and Plan:   Cameron Payne is a 42 y.o. 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, initial diagnosis June 2019, in clinical remission  We will repeat colonoscopy in 6 to 12 months from initial colonoscopy to evaluate for histologic remission Continue mesalamine orally and suppository as it is keeping patient in remission If no flareups in  a year, can consider decreasing or discontinuing some of his medications We will check labs today Patient asked to obtain his influenza vaccination if he has not done so already  We are not anticipating starting him on any immunosuppressants, therefore ammonia and hepatitis A and B vaccination not indicated at this time  He was  referred to hematology for microcytic anemia and they have diagnosed him with alpha thalassemia trait.  No iron deficiency.  Dr [name]

## 2018-04-18 LAB — COMPREHENSIVE METABOLIC PANEL
A/G RATIO: 2 (ref 1.2–2.2)
ALBUMIN: 4.5 g/dL (ref 3.5–5.5)
ALK PHOS: 52 IU/L (ref 39–117)
ALT: 9 IU/L (ref 0–44)
AST: 17 IU/L (ref 0–40)
BILIRUBIN TOTAL: 0.2 mg/dL (ref 0.0–1.2)
BUN / CREAT RATIO: 13 (ref 9–20)
BUN: 13 mg/dL (ref 6–24)
CHLORIDE: 103 mmol/L (ref 96–106)
CO2: 26 mmol/L (ref 20–29)
Calcium: 9.7 mg/dL (ref 8.7–10.2)
Creatinine, Ser: 1.04 mg/dL (ref 0.76–1.27)
GFR calc non Af Amer: 89 mL/min/{1.73_m2} (ref >59)
GFR, EST AFRICAN AMERICAN: 103 mL/min/{1.73_m2} (ref >59)
GLUCOSE: 85 mg/dL (ref 65–99)
Globulin, Total: 2.3 g/dL (ref 1.5–4.5)
POTASSIUM: 4.4 mmol/L (ref 3.5–5.2)
Sodium: 144 mmol/L (ref 134–144)
Total Protein: 6.8 g/dL (ref 6.0–8.5)

## 2018-04-23 ENCOUNTER — Other Ambulatory Visit: Payer: Self-pay

## 2018-04-23 MED ORDER — MESALAMINE 800 MG PO TBEC: 800.0000 mg | DELAYED_RELEASE_TABLET | Freq: Three times a day (TID) | ORAL | 6 refills | Status: DC

## 2018-04-24 ENCOUNTER — Telehealth: Payer: Self-pay | Admitting: Gastroenterology

## 2018-04-24 NOTE — Telephone Encounter (Signed)
Cameron Payne from [employer] left vm to receive additional diagnosis codes regarding visit from 04-Dec-2017 please call her at  cb [phone] ref# [id number]

## 2018-04-25 NOTE — Telephone Encounter (Signed)
Received paper work from [employer] and sending it in.

## 2018-05-21 ENCOUNTER — Other Ambulatory Visit: Payer: Self-pay

## 2018-05-21 MED ORDER — MESALAMINE 1000 MG RE SUPP: 1000.0000 mg | Freq: Every day | RECTAL | 2 refills | Status: DC

## 2018-07-04 ENCOUNTER — Telehealth: Payer: Self-pay | Admitting: Gastroenterology

## 2018-07-04 NOTE — Telephone Encounter (Signed)
Mrs. Cameron Payne left vm regarding pt rx masolamine she states the formula order was not added to pharmacy and insurance would cover it if we contacted them

## 2018-07-13 ENCOUNTER — Telehealth: Payer: Self-pay | Admitting: Gastroenterology

## 2018-07-13 NOTE — Telephone Encounter (Addendum)
I spoke with Cameron Payne and informed her that we need to do a prior authorization. This is new insurance.

## 2018-07-13 NOTE — Telephone Encounter (Signed)
Pt  Mother is calling for Cameron Payne to clarify the rx clearance will be for [name] not herself

## 2018-07-13 NOTE — Telephone Encounter (Signed)
Noted  

## 2018-08-08 ENCOUNTER — Other Ambulatory Visit: Payer: Self-pay

## 2018-08-08 MED ORDER — MESALAMINE 1.2 G PO TBEC: 1.2000 g | DELAYED_RELEASE_TABLET | Freq: Two times a day (BID) | ORAL | 3 refills | Status: DC

## 2018-08-08 NOTE — Telephone Encounter (Signed)
Pt insurance has approved Mesalamine 1.2 Gm  2xd. Mariann Laster (pts mother notified.)

## 2018-10-08 ENCOUNTER — Ambulatory Visit: Payer: Medicare Other | Admitting: Gastroenterology

## 2018-10-17 ENCOUNTER — Ambulatory Visit: Payer: Medicare Other | Admitting: Gastroenterology

## 2018-11-26 ENCOUNTER — Telehealth: Payer: Self-pay | Admitting: Gastroenterology

## 2018-11-26 ENCOUNTER — Other Ambulatory Visit: Payer: Self-pay

## 2018-11-26 MED ORDER — MESALAMINE 1.2 G PO TBEC: 1.2000 g | DELAYED_RELEASE_TABLET | Freq: Two times a day (BID) | ORAL | 3 refills | Status: DC

## 2018-11-26 NOTE — Telephone Encounter (Signed)
Mariann Laster (pt's mother) notified that rx refill for mesalamine 1.2 g EC was sent to pharmacy.

## 2018-11-26 NOTE — Telephone Encounter (Signed)
Patient's mother called in needing a refill on   mesalamine (LIALDA) 1.2 g EC tablet   Called into [facility]. She called the pharmacy last week & there a are no refille. The pharmacy said they would sent a request to [place]. Today she called them with  & they do not have the refill

## 2018-12-19 ENCOUNTER — Encounter: Payer: Self-pay | Admitting: Gastroenterology

## 2018-12-19 ENCOUNTER — Ambulatory Visit (INDEPENDENT_AMBULATORY_CARE_PROVIDER_SITE_OTHER): Payer: Medicare Other | Admitting: Gastroenterology

## 2018-12-19 ENCOUNTER — Other Ambulatory Visit: Payer: Self-pay

## 2018-12-19 VITALS — BP 140/82 | HR 76 | Temp 97.8°F | Ht 71.0 in | Wt 152.0 lb

## 2018-12-19 DIAGNOSIS — K5180 Other ulcerative colitis without complications: Secondary | ICD-10-CM

## 2018-12-19 NOTE — Progress Notes (Signed)
[name], MD [address]  [place]  [place], [place] [address]  Main: [phone]  Fax: [phone]   Primary Care Physician: Elba Barman, MD   Chief Complaint  Patient presents with  . Follow-up    elevated liver enzymes, and ulcerative colitis    HPI: Cameron Payne is a 43 y.o. male with history of ulcerative colitis here for follow-up.  The patient denies abdominal or flank pain, anorexia, nausea or vomiting, dysphagia, change in bowel habits or black or bloody stools or weight loss.  Reports 1-2 formed bowel movements a day without blood  Compliant with Lialda 1.2 g twice daily and mesalamine suppository  Initially seen in clinic: November 01, 2017  Presenting symptom in clinic:Intermittent bright red blood per rectum. Mother noted weight loss at home. Pertinentinitiallab findings: Fecal calprotectin mildly elevated at 126  Index colonoscopy: November 22, 2017 Colonoscopy findings: Impression: - The examination was suspicious for rectal ulcerative  colitis ulcerative colitis. - The examined portion of the ileum was normal.  Biopsied. - Erythematous mucosa at the appendiceal orifice.  Biopsied. - Patchy mild mucosal changes were found in the rectum,  rule out ulcerative colitis. Biopsied. - The sigmoid colon, descending colon, transverse  colon, ascending colon and cecum are normal. Biopsied. - The distal rectum and anal verge are normal on  retroflexion view.  EGD: November 22, 2017 Impression: - White nummular lesions in esophageal mucosa.  Brushings performed. - Normal stomach. - Normal duodenal bulb, second portion of  the duodenum  and examined duodenum. Biopsied.   Surgical Pathology    DIAGNOSIS:  A. DUODENUM, SECOND PORTION END BULB; COLD BIOPSY:  - DUODENAL MUCOSA WITH PRESERVED VILLOUS ARCHITECTURE AND FOCAL  BRUNNER'S GLAND HYPERPLASIA.  - NEGATIVE FOR INTRA-EPITHELIAL LYMPHOCYTOSIS, DYSPLASIA AND MALIGNANCY.   B. TERMINAL ILEUM; COLD BIOPSY:  - SMALL BOWEL MUCOSA WITH INTACT VILLOUS ARCHITECTURE.  - NEGATIVE FOR INTRAEPITHELIAL LYMPHOCYTOSIS, DYSPLASIA AND MALIGNANCY.   C. COLON, APPENDICEAL ORIFICE; COLD BIOPSY:  - MILD ACTIVE COLITIS.  - NEGATIVE FOR DYSPLASIA AND MALIGNANCY.   D. COLON, CECUM AND ASCENDING; COLD BIOPSY:  - MUCOSAL EDEMA AND PROMINENT LYMPHOID AGGREGATES.  - NEGATIVE FOR DYSPLASIA AND MALIGNANCY.   E. TRANSVERSE COLON; COLD BIOPSY:  - MUCOSAL HEMORRHAGE AND PROMINENT LYMPHOID AGGREGATES.  - NEGATIVE FOR DYSPLASIA AND MALIGNANCY.   F. COLON, DESCENDING AND SIGMOID; COLD BIOPSY:  - MUCOSAL HEMORRHAGE.  - NEGATIVE FOR DYSPLASIA AND MALIGNANCY.   G. RECTUM; COLD BIOPSY:  - MODERATE ACTIVE COLITIS/PROCTITIS WITH ARCHITECTURAL FEATURES OF  CHRONICITY.  - NEGATIVE FOR DYSPLASIA AND MALIGNANCY.   Note: Sections of rectum show active colitis in form of cryptitis and  crypt abscess formation. Architectural features of chronicity are  present and include basal plasmacytosis, crypt dropout and crypt  irregularities. The differential diagnosis includes infectious colitis,  ischemia, drug (NSAIDs vs other), and idiopathic inflammatory bowel  disease (favored given clinical impression). Clinical correlation is  required.   ADDENDUM:  Sections of part C (appendiceal orifice) are re-reviewed at the  clinician's request. Mild crypt disarray and focal drop out is seen,  suggestive of chronicity. Negative for infectious agents. Clinical  correlation is required regarding if the location sampled represents an  area of acute appendicitis. In  conjunction with findings in the rectum,  the differential diagnosis includes infectious colitis, ischemia, drug  (NSAIDs vs other), biopsy adjacent to an inflamed diverticulum, and  early idiopathic inflammatory bowel disease. Clinical correlation is  required.  Current Outpatient Medications  Medication Sig Dispense Refill  . Ascorbic Acid (VITAMIN C ADULT GUMMIES) 125 MG CHEW Chew by mouth.    . Calcium-Phosphorus-Vitamin D (CALCIUM/VITAMIN D3/ADULT GUMMY PO) Take by mouth.    . fexofenadine (ALLEGRA) 180 MG tablet Take 180 mg by mouth daily.    . folic acid (FOLVITE) 161 MCG tablet Take 1 tablet (400 mcg total) by mouth daily. 90 tablet 1  . mesalamine (CANASA) 1000 MG suppository Place 1 suppository (1,000 mg total) rectally at bedtime. 30 suppository 2  . mesalamine (LIALDA) 1.2 g EC tablet Take 1 tablet (1.2 g total) by mouth 2 (two) times daily. 60 tablet 3  . hydrochlorothiazide (MICROZIDE) 12.5 MG capsule Take 12.5 mg by mouth daily.     No current facility-administered medications for this visit.     Allergies as of 12/19/2018  . (No Known Allergies)    ROS:  General: Negative for anorexia, weight loss, fever, chills, fatigue, weakness. ENT: Negative for hoarseness, difficulty swallowing , nasal congestion. CV: Negative for chest pain, angina, palpitations, dyspnea on exertion, peripheral edema.  Respiratory: Negative for dyspnea at rest, dyspnea on exertion, cough, sputum, wheezing.  GI: See history of present illness. GU:  Negative for dysuria, hematuria, urinary incontinence, urinary frequency, nocturnal urination.  Endo: Negative for unusual weight change.    Physical Examination:   BP 140/82   Pulse 76   Temp 97.8 F (36.6 C) (Oral)   Ht 5' 11"  (1.803 m)   Wt 152 lb (68.9 kg)   BMI 21.20 kg/m   General: Well-nourished, well-developed in no acute distress.  Eyes: No icterus. Conjunctivae pink. Mouth: Oropharyngeal mucosa moist and pink , no  lesions erythema or exudate. Neck: Supple, Trachea midline Abdomen: Bowel sounds are normal, nontender, nondistended, no hepatosplenomegaly or masses, no abdominal bruits or hernia , no rebound or guarding.   Extremities: No lower extremity edema. No clubbing or deformities. Neuro: Alert and oriented x 3.  Grossly intact. Skin: Warm and dry, no jaundice.   Psych: Alert and cooperative, normal mood and affect.   Labs: CMP     Component Value Date/Time   NA 144 04/17/2018 1019   NA 133 (L) 07/05/2013 0747   K 4.4 04/17/2018 1019   K 3.8 07/05/2013 0747   CL 103 04/17/2018 1019   CL 100 07/05/2013 0747   CO2 26 04/17/2018 1019   CO2 32 07/05/2013 0747   GLUCOSE 85 04/17/2018 1019   GLUCOSE 90 01/15/2018 1213   GLUCOSE 94 07/05/2013 0747   BUN 13 04/17/2018 1019   BUN 10 07/05/2013 0747   CREATININE 1.04 04/17/2018 1019   CREATININE 1.05 07/05/2013 0747   CALCIUM 9.7 04/17/2018 1019   CALCIUM 9.4 07/05/2013 0747   PROT 6.8 04/17/2018 1019   ALBUMIN 4.5 04/17/2018 1019   AST 17 04/17/2018 1019   ALT 9 04/17/2018 1019   ALKPHOS 52 04/17/2018 1019   BILITOT 0.2 04/17/2018 1019   GFRNONAA 89 04/17/2018 1019   GFRNONAA >60 07/05/2013 0747   GFRAA 103 04/17/2018 1019   GFRAA >60 07/05/2013 0747   Lab Results  Component Value Date   WBC 6.2 02/26/2018   HGB 12.8 (L) 02/26/2018   HCT 41.1 02/26/2018   MCV 73.3 (L) 02/26/2018   PLT 203 02/26/2018    Imaging Studies: No results found.  Assessment and Plan:   Cameron Payne is a 43 y.o. 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  And if flareup does occur he is likely to go back into remission quickly with rectal mesalamine  I have discussed alternative options, risks & benefits,  which include, but are not limited to, bleeding, infection, perforation,respiratory complication & drug reaction.  The patient agrees with this plan & written consent will be obtained.    We will update labs at this time as well  Dr [name]

## 2018-12-20 LAB — CBC
Hematocrit: 41.7 % (ref 37.5–51.0)
Hemoglobin: 12.6 g/dL — ABNORMAL LOW (ref 13.0–17.7)
MCH: 23.3 pg — ABNORMAL LOW (ref 26.6–33.0)
MCHC: 30.2 g/dL — ABNORMAL LOW (ref 31.5–35.7)
MCV: 77 fL — ABNORMAL LOW (ref 79–97)
Platelets: 220 10*3/uL (ref 150–450)
RBC: 5.4 x10E6/uL (ref 4.14–5.80)
RDW: 12.9 % (ref 11.6–15.4)
WBC: 7.3 10*3/uL (ref 3.4–10.8)

## 2018-12-20 LAB — COMPREHENSIVE METABOLIC PANEL
ALT: 9 IU/L (ref 0–44)
AST: 14 IU/L (ref 0–40)
Albumin/Globulin Ratio: 2.1 (ref 1.2–2.2)
Albumin: 4.7 g/dL (ref 4.0–5.0)
Alkaline Phosphatase: 48 IU/L (ref 39–117)
BUN/Creatinine Ratio: 18 (ref 9–20)
BUN: 17 mg/dL (ref 6–24)
Bilirubin Total: 0.2 mg/dL (ref 0.0–1.2)
CO2: 25 mmol/L (ref 20–29)
Calcium: 9.3 mg/dL (ref 8.7–10.2)
Chloride: 102 mmol/L (ref 96–106)
Creatinine, Ser: 0.92 mg/dL (ref 0.76–1.27)
GFR calc Af Amer: 119 mL/min/{1.73_m2} (ref >59)
GFR calc non Af Amer: 103 mL/min/{1.73_m2} (ref >59)
Globulin, Total: 2.2 g/dL (ref 1.5–4.5)
Glucose: 94 mg/dL (ref 65–99)
Potassium: 4.3 mmol/L (ref 3.5–5.2)
Sodium: 141 mmol/L (ref 134–144)
Total Protein: 6.9 g/dL (ref 6.0–8.5)

## 2018-12-20 LAB — C-REACTIVE PROTEIN: CRP: <1 mg/L (ref 0–10)

## 2019-01-07 ENCOUNTER — Other Ambulatory Visit
Admission: RE | Admit: 2019-01-07 | Discharge: 2019-01-07 | Disposition: A | Discharge status: Home / Self Care | Payer: Medicare Other | Source: Ambulatory Visit | Attending: Gastroenterology | Admitting: Gastroenterology

## 2019-01-07 ENCOUNTER — Other Ambulatory Visit: Payer: Self-pay

## 2019-01-07 ENCOUNTER — Telehealth: Payer: Self-pay | Admitting: Gastroenterology

## 2019-01-07 DIAGNOSIS — Z01812 Encounter for preprocedural laboratory examination: Secondary | ICD-10-CM | POA: Insufficient documentation

## 2019-01-07 DIAGNOSIS — Z20828 Contact with and (suspected) exposure to other viral communicable diseases: Secondary | ICD-10-CM | POA: Diagnosis not present

## 2019-01-07 LAB — SARS CORONAVIRUS 2 (TAT 6-24 HRS): SARS Coronavirus 2: NEGATIVE

## 2019-01-09 ENCOUNTER — Encounter: Payer: Self-pay | Admitting: *Deleted

## 2019-01-10 ENCOUNTER — Encounter
Admission: RE | Disposition: A | Discharge status: Home / Self Care | Payer: Self-pay | Source: Home / Self Care | Attending: Gastroenterology

## 2019-01-10 ENCOUNTER — Ambulatory Visit
Admission: RE | Admit: 2019-01-10 | Discharge: 2019-01-10 | Disposition: A | Discharge status: Home / Self Care | Payer: Medicare Other | Attending: Gastroenterology | Admitting: Gastroenterology

## 2019-01-10 ENCOUNTER — Ambulatory Visit: Payer: Medicare Other | Admitting: Anesthesiology

## 2019-01-10 ENCOUNTER — Encounter: Payer: Self-pay | Admitting: *Deleted

## 2019-01-10 ENCOUNTER — Other Ambulatory Visit: Payer: Self-pay

## 2019-01-10 DIAGNOSIS — Z8249 Family history of ischemic heart disease and other diseases of the circulatory system: Secondary | ICD-10-CM | POA: Insufficient documentation

## 2019-01-10 DIAGNOSIS — K6289 Other specified diseases of anus and rectum: Secondary | ICD-10-CM | POA: Insufficient documentation

## 2019-01-10 DIAGNOSIS — K5289 Other specified noninfective gastroenteritis and colitis: Secondary | ICD-10-CM | POA: Insufficient documentation

## 2019-01-10 DIAGNOSIS — I10 Essential (primary) hypertension: Secondary | ICD-10-CM | POA: Diagnosis not present

## 2019-01-10 DIAGNOSIS — Z833 Family history of diabetes mellitus: Secondary | ICD-10-CM | POA: Insufficient documentation

## 2019-01-10 DIAGNOSIS — Z803 Family history of malignant neoplasm of breast: Secondary | ICD-10-CM | POA: Insufficient documentation

## 2019-01-10 DIAGNOSIS — Z8669 Personal history of other diseases of the nervous system and sense organs: Secondary | ICD-10-CM | POA: Insufficient documentation

## 2019-01-10 DIAGNOSIS — Z79899 Other long term (current) drug therapy: Secondary | ICD-10-CM | POA: Diagnosis not present

## 2019-01-10 DIAGNOSIS — K5190 Ulcerative colitis, unspecified, without complications: Secondary | ICD-10-CM | POA: Diagnosis not present

## 2019-01-10 DIAGNOSIS — Z87891 Personal history of nicotine dependence: Secondary | ICD-10-CM | POA: Diagnosis not present

## 2019-01-10 DIAGNOSIS — D649 Anemia, unspecified: Secondary | ICD-10-CM | POA: Diagnosis not present

## 2019-01-10 DIAGNOSIS — K5180 Other ulcerative colitis without complications: Secondary | ICD-10-CM | POA: Diagnosis not present

## 2019-01-10 DIAGNOSIS — F89 Unspecified disorder of psychological development: Secondary | ICD-10-CM | POA: Diagnosis not present

## 2019-01-10 DIAGNOSIS — Z800 Family history of malignant neoplasm of digestive organs: Secondary | ICD-10-CM | POA: Insufficient documentation

## 2019-01-10 HISTORY — PX: COLONOSCOPY WITH PROPOFOL: SHX5780

## 2019-01-10 SURGERY — COLONOSCOPY WITH PROPOFOL
Anesthesia: General

## 2019-01-10 MED ORDER — PROPOFOL 10 MG/ML IV BOLUS
INTRAVENOUS | Status: DC | PRN
  Administered 2019-01-10: 70 mg via INTRAVENOUS

## 2019-01-10 MED ORDER — PHENYLEPHRINE HCL (PRESSORS) 10 MG/ML IV SOLN
INTRAVENOUS | Status: DC | PRN
  Administered 2019-01-10 (×2): 100 ug via INTRAVENOUS

## 2019-01-10 MED ORDER — MIDAZOLAM HCL 5 MG/5ML IJ SOLN
INTRAMUSCULAR | Status: DC | PRN
  Administered 2019-01-10 (×2): 1 mg via INTRAVENOUS

## 2019-01-10 MED ORDER — SODIUM CHLORIDE 0.9 % IV SOLN
INTRAVENOUS | Status: DC
  Administered 2019-01-10: 1000 mL via INTRAVENOUS

## 2019-01-10 MED ORDER — PROPOFOL 500 MG/50ML IV EMUL
INTRAVENOUS | Status: AC
  Filled 2019-01-10: qty 100

## 2019-01-10 MED ORDER — MIDAZOLAM HCL 2 MG/2ML IJ SOLN
INTRAMUSCULAR | Status: AC
  Filled 2019-01-10: qty 2

## 2019-01-10 MED ORDER — PROPOFOL 500 MG/50ML IV EMUL
INTRAVENOUS | Status: DC | PRN
  Administered 2019-01-10: 140 ug/kg/min via INTRAVENOUS

## 2019-01-10 MED ORDER — LIDOCAINE HCL (CARDIAC) PF 100 MG/5ML IV SOSY
PREFILLED_SYRINGE | INTRAVENOUS | Status: DC | PRN
  Administered 2019-01-10: 60 mg via INTRAVENOUS

## 2019-01-10 NOTE — Transfer of Care (Signed)
Immediate Anesthesia Transfer of Care Note  Patient: Cameron Payne  Procedure(s) Performed: COLONOSCOPY WITH PROPOFOL (N/A )  Patient Location: PACU  Anesthesia Type:General  Level of Consciousness: awake, alert  and oriented  Airway & Oxygen Therapy: Patient Spontanous Breathing and Patient connected to nasal cannula oxygen  Post-op Assessment: Report given to RN and Post -op Vital signs reviewed and stable  Post vital signs: Reviewed and stable  Last Vitals:  Vitals Value Taken Time  BP 106/63 01/10/19 1025  Temp 36.1 C 01/10/19 1024  Pulse 91 01/10/19 1028  Resp 23 01/10/19 1028  SpO2 97 % 01/10/19 1028  Vitals shown include unvalidated device data.  Last Pain:  Vitals:   01/10/19 1024  TempSrc:   PainSc: Asleep         Complications: No apparent anesthesia complications

## 2019-01-10 NOTE — Op Note (Signed)
[facility] Patient Name: Cameron Payne Procedure Date: 01/10/2019 9:21 AM MRN: [id number] Account #: [id number] Date of Birth: March 02, 1976 Admit Type: Outpatient Age: 42 Room: [facility] END[facility] ROOM 4 Gender: Male Note Status: Finalized Procedure:            Colonoscopy Indications:          Follow-up of ulcerative colitis Providers:            Varnita B. Bonna Gains MD, MD Referring MD:         Marrianne Mood, MD (Referring MD) Medicines:            Monitored Anesthesia Care Complications:        No immediate complications. Procedure:            Pre-Anesthesia Assessment:                       - Prior to the procedure, a History and Physical was                        performed, and patient medications, allergies and                        sensitivities were reviewed. The patient's tolerance of                        previous anesthesia was reviewed.                       - The risks and benefits of the procedure and the                        sedation options and risks were discussed with the                        patient. All questions were answered and informed                        consent was obtained.                       - Patient identification and proposed procedure were                        verified prior to the procedure by the physician, the                        nurse, the anesthesiologist, the anesthetist and the                        technician. The procedure was verified in the                        pre-procedure area in the procedure room in the                        endoscopy suite.                       - Prophylactic Antibiotics: The patient does not  require prophylactic antibiotics.                       - ASA Grade Assessment: II - A patient with mild                        systemic disease.                       - After reviewing the risks and benefits, the patient                        was deemed in  satisfactory condition to undergo the                        procedure.                       - Monitored anesthesia care was determined to be                        medically necessary for this procedure based on review                        of the patient's medical history, medications, and                        prior anesthesia history.                       - The anesthesia plan was to use monitored anesthesia                        care (MAC).                       After obtaining informed consent, the colonoscope was                        passed under direct vision. Throughout the procedure,                        the patient's blood pressure, pulse, and oxygen                        saturations were monitored continuously. The                        Colonoscope was introduced through the anus and                        advanced to the the cecum, identified by appendiceal                        orifice and ileocecal valve. The colonoscopy was                        performed with ease. The patient tolerated the                        procedure well. The quality of the bowel preparation  was good. Findings:      The perianal and digital rectal examinations were normal.      A patchy area of mildly erythematous mucosa was found in the cecum.       Biopsies were taken with a cold forceps for histology.      A patchy area of mildly erythematous mucosa was found in the transverse       colon and in the ascending colon. Biopsies were taken with a cold       forceps for histology. The areas of erythema were small and biopsies       were taken of normal and erythematous mucosa. Biopsies were obtained in       the descending colon, in the transverse colon and in the ascending colon       with cold forceps for histology.      A patchy area of mildly erythematous mucosa was found in the sigmoid       colon. Biopsies were taken with a cold forceps for histology. Most  the       sigmoid colon appeared normal. Only a small area appeared to have loss       of vascular pattern and mild erythema. Both normal and erythematous       mucosa was biopsied.      A patchy area of mildly erythematous mucosa was found in the rectum.       Biopsies were taken with a cold forceps for histology.      The retroflexed view of the distal rectum and anal verge was normal and       showed no anal or rectal abnormalities.      Inflammation was found in the colon. This was graded as Mayo Score 1       (mild, with erythema, decreased vascular pattern, mild friability). Impression:           - Erythematous mucosa in the cecum. Biopsied.                       - Erythematous mucosa in the transverse colon and in                        the ascending colon. Biopsied.                       - Erythematous mucosa in the sigmoid colon. Biopsied.                       - Erythematous mucosa in the rectum. Biopsied.                       - The distal rectum and anal verge are normal on                        retroflexion view.                       - Biopsies were obtained in the descending colon, in                        the transverse colon and in the ascending colon.                       - The areas of erythema  were in the periappendiceal                        region of the cecum, very patchy erythema in the                        ascending, transverse and sigmoid colon. Mild pathcy                        erythema in the rectum. Rest of the colon was normal. Recommendation:       - Await pathology results.                       - Continue present medications.                       - Return to my office as previously scheduled.                       - Resume regular diet.                       - Avoid NSAIDs except Aspirin if medically indicated by                        PCP                       - The findings and recommendations were discussed with                        the  patient.                       - The findings and recommendations were discussed with                        the patient's family. Procedure Code(s):    --- Professional ---                       [phone], Colonoscopy, flexible; with biopsy, single or                        multiple Diagnosis Code(s):    --- Professional ---                       K63.89, Other specified diseases of intestine                       K62.89, Other specified diseases of anus and rectum                       K51.90, Ulcerative colitis, unspecified, without                        complications CPT copyright 2019 American Medical Association. All rights reserved. The codes documented in this report are preliminary and upon coder review may  be revised to meet current compliance requirements.  [name], MD Margretta Sidle B. Bonna Gains MD, MD 01/10/2019 10:37:11 AM This report has been signed electronically. Number of Addenda: 0 Note Initiated On: 01/10/2019 9:21 AM Scope Withdrawal Time: 0 hours  23 minutes 19 seconds  Total Procedure Duration: 0 hours 41 minutes 2 seconds  Estimated Blood Loss: Estimated blood loss: none.      [facility]

## 2019-01-10 NOTE — H&P (Signed)
Cameron Antigua, MD [address], [place], [place], [place], [address] [address], [place], [place], [place], [address] Phone: [phone]  Fax: [phone]  Primary Care Physician:  Elba Barman, MD   Pre-Procedure History & Physical: HPI:  Cameron Payne is a 43 y.o. adult is here for a colonoscopy.   Past Medical History:  Diagnosis Date  . Developmental disability   . Hypertension   . Seizures ([place])    as a child    Past Surgical History:  Procedure Laterality Date  . COLONOSCOPY WITH PROPOFOL N/A 11/22/2017   Procedure: COLONOSCOPY WITH PROPOFOL;  Surgeon: Virgel Manifold, MD;  Location: [facility] ENDOSCOPY;  Service: Endoscopy;  Laterality: N/A;  . ESOPHAGOGASTRODUODENOSCOPY (EGD) WITH PROPOFOL N/A 11/22/2017   Procedure: ESOPHAGOGASTRODUODENOSCOPY (EGD) WITH PROPOFOL;  Surgeon: Virgel Manifold, MD;  Location: [facility] ENDOSCOPY;  Service: Endoscopy;  Laterality: N/A;    Prior to Admission medications   Medication Sig Start Date End Date Taking? Authorizing Provider  Ascorbic Acid (VITAMIN C ADULT GUMMIES) 125 MG CHEW Chew by mouth.   Yes [provider]  Calcium-Phosphorus-Vitamin D (CALCIUM/VITAMIN D3/ADULT GUMMY PO) Take by mouth.   Yes [provider]  fexofenadine (ALLEGRA) 180 MG tablet Take 180 mg by mouth daily.   Yes [provider]  folic acid (FOLVITE) 458 MCG tablet Take 1 tablet (400 mcg total) by mouth daily. 03/12/18  Yes Earlie Server, MD  mesalamine (CANASA) 1000 MG suppository Place 1 suppository (1,000 mg total) rectally at bedtime. 05/21/18  Yes Virgel Manifold, MD  mesalamine (LIALDA) 1.2 g EC tablet Take 1 tablet (1.2 g total) by mouth 2 (two) times daily. 11/26/18  Yes Cameron Payne B, MD  hydrochlorothiazide (MICROZIDE) 12.5 MG capsule Take 12.5 mg by mouth daily.    [provider]    Allergies as of 12/20/2018  . (No Known Allergies)    Family History  Problem Relation Age of Onset  .  Diabetes Mother   . Heart failure Mother   . Cancer Father        liver  . Cancer Paternal Grandfather        does not know what kind  . Cancer Maternal Aunt        breast (half sister) pt mother was adopted  . Colon cancer Neg Hx   . Thyroid disease Neg Hx     Social History   Socioeconomic History  . Marital status: Single    Spouse name: Not on file  . Number of children: Not on file  . Years of education: Not on file  . Highest education level: Not on file  Occupational History  . Not on file  Social Needs  . Financial resource strain: Not on file  . Food insecurity    Worry: Not on file    Inability: Not on file  . Transportation needs    Medical: Not on file    Non-medical: Not on file  Tobacco Use  . Smoking status: Former Smoker    Years: 25.00    Types: Cigars    Quit date: 06/11/2018    Years since quitting: 0.5  . Smokeless tobacco: Never Used  . Tobacco comment: occ.   Substance and Sexual Activity  . Alcohol use: Yes    Alcohol/week: 1.0 standard drinks    Types: 1 Cans of beer per week    Comment: occ.  . Drug use: Never  . Sexual activity: Not on file  Lifestyle  .  Physical activity    Days per week: Not on file    Minutes per session: Not on file  . Stress: Not on file  Relationships  . Social Herbalist on phone: Not on file    Gets together: Not on file    Attends religious service: Not on file    Active member of club or organization: Not on file    Attends meetings of clubs or organizations: Not on file    Relationship status: Not on file  . Intimate partner violence    Fear of current or ex partner: Not on file    Emotionally abused: Not on file    Physically abused: Not on file    Forced sexual activity: Not on file  Other Topics Concern  . Not on file  Social History Narrative  . Not on file    Review of Systems: See HPI, otherwise negative ROS  Physical Exam: BP (!) 146/88   Pulse 67   Temp (!) 97.3 F (36.3  C) (Tympanic)   Resp 18   Ht 5' 11"  (1.803 m)   Wt 82.6 kg   SpO2 100%   BMI 25.38 kg/m  General:   Alert,  pleasant and cooperative in NAD Head:  Normocephalic and atraumatic. Neck:  Supple; no masses or thyromegaly. Lungs:  Clear throughout to auscultation, normal respiratory effort.    Heart:  +S1, +S2, Regular rate and rhythm, No edema. Abdomen:  Soft, nontender and nondistended. Normal bowel sounds, without guarding, and without rebound.   Neurologic:  Alert and  oriented x4;  grossly normal neurologically.  Impression/Plan: Cameron Payne is here for a colonoscopy to be performed for history of UC.   Risks, benefits, limitations, and alternatives regarding  colonoscopy have been reviewed with the patient.  Questions have been answered.  All parties agreeable.   Virgel Manifold, MD  01/10/2019, 9:31 AM

## 2019-01-10 NOTE — Anesthesia Preprocedure Evaluation (Addendum)
Anesthesia Evaluation  Patient identified by MRN, date of birth, ID band Patient awake    Reviewed: Allergy & Precautions, H&P , NPO status , Patient's Chart, lab work & pertinent test results  Airway Mallampati: II  TM Distance: >3 FB Neck ROM: full    Dental  (+) Teeth Intact   Pulmonary former smoker,           Cardiovascular hypertension,   Echo 2018: NORMAL LEFT VENTRICULAR SYSTOLIC FUNCTION NORMAL RIGHT VENTRICULAR SYSTOLIC FUNCTION NO VALVULAR STENOSIS Mitral: TRIVIAL MR Tricuspid: TRIVIAL TR Closest EF: >55% (Estimated)   Neuro/Psych Seizures -,  negative psych ROS   GI/Hepatic Neg liver ROS, UC   Endo/Other  negative endocrine ROS  Renal/GU negative Renal ROS  negative genitourinary   Musculoskeletal   Abdominal   Peds  Hematology  (+) Blood dyscrasia, anemia ,   Anesthesia Other Findings Past Medical History: No date: Developmental disability No date: Hypertension No date: Seizures ([place])     Comment:  as a child  Past Surgical History: 11/22/2017: COLONOSCOPY WITH PROPOFOL; N/A     Comment:  Procedure: COLONOSCOPY WITH PROPOFOL;  Surgeon:               Virgel Manifold, MD;  Location: [facility] ENDOSCOPY;                Service: Endoscopy;  Laterality: N/A; 11/22/2017: ESOPHAGOGASTRODUODENOSCOPY (EGD) WITH PROPOFOL; N/A     Comment:  Procedure: ESOPHAGOGASTRODUODENOSCOPY (EGD) WITH               PROPOFOL;  Surgeon: Virgel Manifold, MD;  Location:               [facility] ENDOSCOPY;  Service: Endoscopy;  Laterality: N/A;  BMI    Body Mass Index: 25.38 kg/m      Reproductive/Obstetrics negative OB ROS                            Anesthesia Physical Anesthesia Plan  ASA: II  Anesthesia Plan: General   Post-op Pain Management:    Induction:   PONV Risk Score and Plan: Propofol infusion and TIVA  Airway Management Planned:   Additional Equipment:   Intra-op  Plan:   Post-operative Plan:   Informed Consent: I have reviewed the patients History and Physical, chart, labs and discussed the procedure including the risks, benefits and alternatives for the proposed anesthesia with the patient or authorized representative who has indicated his/her understanding and acceptance.     Dental Advisory Given  Plan Discussed with: Anesthesiologist and CRNA  Anesthesia Plan Comments:         Anesthesia Quick Evaluation

## 2019-01-10 NOTE — Anesthesia Post-op Follow-up Note (Signed)
Anesthesia QCDR form completed.        

## 2019-01-11 ENCOUNTER — Encounter: Payer: Self-pay | Admitting: Gastroenterology

## 2019-01-11 LAB — SURGICAL PATHOLOGY

## 2019-01-11 NOTE — Anesthesia Postprocedure Evaluation (Signed)
Anesthesia Post Note  Patient: Cameron Payne  Procedure(s) Performed: COLONOSCOPY WITH PROPOFOL (N/A )  Patient location during evaluation: PACU Anesthesia Type: General Level of consciousness: awake and alert Pain management: pain level controlled Vital Signs Assessment: post-procedure vital signs reviewed and stable Respiratory status: spontaneous breathing, nonlabored ventilation and respiratory function stable Cardiovascular status: blood pressure returned to baseline and stable Postop Assessment: no apparent nausea or vomiting Anesthetic complications: no     Last Vitals:  Vitals:   01/10/19 1044 01/10/19 1054  BP: 125/79 123/79  Pulse: 75 69  Resp: (!) 21 20  Temp:    SpO2: 100% 100%    Last Pain:  Vitals:   01/10/19 1054  TempSrc:   PainSc: 0-No pain                 Durenda Hurt

## 2019-01-21 ENCOUNTER — Other Ambulatory Visit: Payer: Self-pay

## 2019-01-21 ENCOUNTER — Telehealth: Payer: Self-pay

## 2019-01-21 MED ORDER — MESALAMINE 1.2 G PO TBEC: 2.4000 g | DELAYED_RELEASE_TABLET | Freq: Every day | ORAL | 3 refills | Status: DC

## 2019-01-21 MED ORDER — MESALAMINE 1000 MG RE SUPP: 1000.0000 mg | Freq: Every day | RECTAL | 2 refills | Status: DC

## 2019-01-21 NOTE — Telephone Encounter (Signed)
I spoke with Mariann Laster (pt's mother) and informed her of biopsy results and of medication change as in taking 2 tabs, one time a day verses 1 tab., two times daily. Mother voiced understanding. She will also contact office to change her son's 1 month f/u to 3 month f/u. Will also send in refills on Lialda both tabs and suppositories per mother request.

## 2019-01-21 NOTE — Telephone Encounter (Signed)
-----   Message from Virgel Manifold, MD sent at 01/15/2019  1:56 PM EDT ----- Cameron Payne please let patient know, his biopsies are consistent with colitis.  Since his symptoms are well controlled with the current dosage, we will continue the current medications.  I see that he is on Lialda 1.2 g twice daily.  He can start taking 2 pills just once daily instead of 1 pill twice daily, so he would be taking 2.4 g once a day instead of 1.2 g twice a day.  Continue the enemas.  Please make sure he has enough refills, and follow-up in clinic with me in 3 months.

## 2019-01-21 NOTE — Telephone Encounter (Signed)
Unable to reach by phone or leave message on voice mail due to mail box full. Will have front desk call pt's mother Mariann Laster) to make 3 month f/u and have her speak to a nurse regarding results/medication.

## 2019-02-01 NOTE — Telephone Encounter (Signed)
error 

## 2019-02-13 ENCOUNTER — Ambulatory Visit: Payer: Medicare Other | Admitting: Gastroenterology

## 2019-03-11 ENCOUNTER — Inpatient Hospital Stay: Payer: Medicare Other | Admitting: Oncology

## 2019-03-11 ENCOUNTER — Inpatient Hospital Stay: Payer: Medicare Other

## 2019-03-26 NOTE — Progress Notes (Deleted)
Called patient no answer left message  

## 2019-03-27 ENCOUNTER — Inpatient Hospital Stay: Payer: Medicare Other

## 2019-03-27 ENCOUNTER — Inpatient Hospital Stay: Payer: Medicare Other | Admitting: Oncology

## 2019-04-10 ENCOUNTER — Other Ambulatory Visit: Payer: Self-pay

## 2019-04-10 ENCOUNTER — Encounter: Payer: Self-pay | Admitting: Oncology

## 2019-04-10 NOTE — Progress Notes (Signed)
Spoke with patient's mom-Cameron Payne. Cameron Payne stated that he had been doing well with no complaints.

## 2019-04-11 ENCOUNTER — Other Ambulatory Visit: Payer: Self-pay | Admitting: *Deleted

## 2019-04-11 ENCOUNTER — Other Ambulatory Visit: Payer: Self-pay

## 2019-04-11 ENCOUNTER — Encounter (INDEPENDENT_AMBULATORY_CARE_PROVIDER_SITE_OTHER): Payer: Self-pay

## 2019-04-11 ENCOUNTER — Inpatient Hospital Stay: Payer: Medicare Other | Attending: Oncology

## 2019-04-11 ENCOUNTER — Inpatient Hospital Stay (HOSPITAL_BASED_OUTPATIENT_CLINIC_OR_DEPARTMENT_OTHER): Payer: Medicare Other | Admitting: Oncology

## 2019-04-11 VITALS — BP 148/78 | HR 82 | Temp 95.1°F | Resp 16 | Wt 153.2 lb

## 2019-04-11 DIAGNOSIS — D563 Thalassemia minor: Secondary | ICD-10-CM | POA: Insufficient documentation

## 2019-04-11 DIAGNOSIS — D509 Iron deficiency anemia, unspecified: Secondary | ICD-10-CM

## 2019-04-11 DIAGNOSIS — Z803 Family history of malignant neoplasm of breast: Secondary | ICD-10-CM | POA: Diagnosis not present

## 2019-04-11 DIAGNOSIS — Z833 Family history of diabetes mellitus: Secondary | ICD-10-CM | POA: Insufficient documentation

## 2019-04-11 DIAGNOSIS — K51919 Ulcerative colitis, unspecified with unspecified complications: Secondary | ICD-10-CM | POA: Insufficient documentation

## 2019-04-11 DIAGNOSIS — K6289 Other specified diseases of anus and rectum: Secondary | ICD-10-CM | POA: Diagnosis not present

## 2019-04-11 DIAGNOSIS — Z8249 Family history of ischemic heart disease and other diseases of the circulatory system: Secondary | ICD-10-CM | POA: Diagnosis not present

## 2019-04-11 DIAGNOSIS — Z79899 Other long term (current) drug therapy: Secondary | ICD-10-CM | POA: Insufficient documentation

## 2019-04-11 DIAGNOSIS — Z87891 Personal history of nicotine dependence: Secondary | ICD-10-CM | POA: Insufficient documentation

## 2019-04-11 DIAGNOSIS — Z800 Family history of malignant neoplasm of digestive organs: Secondary | ICD-10-CM | POA: Insufficient documentation

## 2019-04-11 DIAGNOSIS — Z809 Family history of malignant neoplasm, unspecified: Secondary | ICD-10-CM | POA: Diagnosis not present

## 2019-04-11 LAB — COMPREHENSIVE METABOLIC PANEL
ALT: 12 U/L (ref 0–44)
AST: 17 U/L (ref 15–41)
Albumin: 4.7 g/dL (ref 3.5–5.0)
Alkaline Phosphatase: 45 U/L (ref 38–126)
Anion gap: 6 (ref 5–15)
BUN: 15 mg/dL (ref 6–20)
CO2: 29 mmol/L (ref 22–32)
Calcium: 9.4 mg/dL (ref 8.9–10.3)
Chloride: 104 mmol/L (ref 98–111)
Creatinine, Ser: 1.02 mg/dL (ref 0.61–1.24)
GFR calc Af Amer: >60 mL/min (ref >60)
GFR calc non Af Amer: >60 mL/min (ref >60)
Glucose, Bld: 97 mg/dL (ref 70–99)
Potassium: 4 mmol/L (ref 3.5–5.1)
Sodium: 139 mmol/L (ref 135–145)
Total Bilirubin: 0.4 mg/dL (ref 0.3–1.2)
Total Protein: 8 g/dL (ref 6.5–8.1)

## 2019-04-11 LAB — CBC WITH DIFFERENTIAL/PLATELET
Abs Immature Granulocytes: 0.01 10*3/uL (ref 0.00–0.07)
Basophils Absolute: 0 10*3/uL (ref 0.0–0.1)
Basophils Relative: 0 %
Eosinophils Absolute: 0.4 10*3/uL (ref 0.0–0.5)
Eosinophils Relative: 6 %
HCT: 41.9 % (ref 39.0–52.0)
Hemoglobin: 12.4 g/dL — ABNORMAL LOW (ref 13.0–17.0)
Immature Granulocytes: 0 %
Lymphocytes Relative: 28 %
Lymphs Abs: 2.2 10*3/uL (ref 0.7–4.0)
MCH: 22.5 pg — ABNORMAL LOW (ref 26.0–34.0)
MCHC: 29.6 g/dL — ABNORMAL LOW (ref 30.0–36.0)
MCV: 76 fL — ABNORMAL LOW (ref 80.0–100.0)
Monocytes Absolute: 0.8 10*3/uL (ref 0.1–1.0)
Monocytes Relative: 10 %
Neutro Abs: 4.4 10*3/uL (ref 1.7–7.7)
Neutrophils Relative %: 56 %
Platelets: 256 10*3/uL (ref 150–400)
RBC: 5.51 MIL/uL (ref 4.22–5.81)
RDW: 13.4 % (ref 11.5–15.5)
WBC: 7.8 10*3/uL (ref 4.0–10.5)
nRBC: 0 % (ref 0.0–0.2)

## 2019-04-11 LAB — IRON AND TIBC
Iron: 62 ug/dL (ref 45–182)
Saturation Ratios: 21 % (ref 17.9–39.5)
TIBC: 294 ug/dL (ref 250–450)
UIBC: 232 ug/dL

## 2019-04-11 LAB — FERRITIN: Ferritin: 278 ng/mL (ref 24–336)

## 2019-04-11 NOTE — Progress Notes (Signed)
Hematology/Oncology follow up note [facility] [phone] [phone]   Patient Care Team: Elba Barman, MD as PCP - General (Family Medicine)  REFERRING PROVIDER: Dr.Tahiliani Margretta Sidle REASON FOR VISIT Follow up for treatment of microcytic anemia  HISTORY OF PRESENTING ILLNESS:  Cameron Payne is a  43 y.o.  adult with PMH listed below who was referred to me for evaluation of microcytic anemia. Patient was diagnosed with ulcerative colitis after presenting with bloody stool in June 2019.  He has establish care with [facility] Dr. Bonna Gains.  Colonoscopy in June 2016 2019 showed suspicion examination for rectal ulcerative colitis.  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 Patient currently is on mesalamine 800 mg 3 times daily, mesalamine suppository 1000 mg at bedtime. Reports that no more additional episodes of bloody stool.  Denies any abdominal pain or black stool. 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 Iron panel was all obtained on 11/08/2017 which showed iron level 67, TIBC 339, ferritin 504.  Patient was referred to me for additional work-up for his microcytic anemia. Patient denies any nausea vomiting, abdominal pain, chest pain, shortness of breath, weight loss.   INTERVAL HISTORY Cameron Payne is a 43 y.o. adult who has above history reviewed by me today presents for follow up visit for management of microcytic anemia/affect thalassemia minor  Problems and complaints are listed below: Patient is accompanied by mother. Reports no new complaints. Denies any blood in the stool, constipation or diarrhea.   . Patient follows up with gastroenterology for ulcerative colitis.  Recently status post colonoscopy. 01/10/2019 colonoscopy  findings erythematous mucosa in the cecum, transverse colon, ascending colon, sigmoid colon and rectum.  Biopsies were obtained.  Pathology showed chronic inflammation.  Active colitis. Patient is currently on mesalamine 2 tablets daily.  He also takes folic acid 505 MCG daily.  He/mother reports his colitis symptoms are well controlled.   Review of Systems  Constitutional: Negative for chills, fever, malaise/fatigue and weight loss.  HENT: Negative for nosebleeds and sore throat.   Eyes: Negative for double vision, photophobia and redness.  Respiratory: Negative for cough, shortness of breath and wheezing.   Cardiovascular: Negative for chest pain, palpitations, orthopnea and leg swelling.  Gastrointestinal: Negative for abdominal pain, blood in stool, nausea and vomiting.  Genitourinary: Negative for dysuria.  Musculoskeletal: Negative for back pain, myalgias and neck pain.  Skin: Negative for itching and rash.  Neurological: Negative for dizziness, tingling and tremors.  Endo/Heme/Allergies: Negative for environmental allergies. Does not bruise/bleed easily.  Psychiatric/Behavioral: Negative for depression and hallucinations.    MEDICAL HISTORY:  Past Medical History:  Diagnosis Date  . Developmental disability   . Hypertension   . Seizures ([place])    as a child    SURGICAL HISTORY: Past Surgical History:  Procedure Laterality Date  . COLONOSCOPY WITH PROPOFOL N/A 11/22/2017   Procedure: COLONOSCOPY WITH PROPOFOL;  Surgeon: Virgel Manifold, MD;  Location: [facility] ENDOSCOPY;  Service: Endoscopy;  Laterality: N/A;  . COLONOSCOPY WITH PROPOFOL N/A 01/10/2019   Procedure: COLONOSCOPY WITH PROPOFOL;  Surgeon: Virgel Manifold, MD;  Location: [facility] ENDOSCOPY;  Service: Endoscopy;  Laterality: N/A;  . ESOPHAGOGASTRODUODENOSCOPY (EGD) WITH PROPOFOL N/A 11/22/2017   Procedure: ESOPHAGOGASTRODUODENOSCOPY (EGD) WITH PROPOFOL;  Surgeon: Virgel Manifold, MD;  Location: [facility] ENDOSCOPY;   Service: Endoscopy;  Laterality: N/A;    SOCIAL HISTORY: Social  History   Socioeconomic History  . Marital status: Single    Spouse name: Not on file  . Number of children: Not on file  . Years of education: Not on file  . Highest education level: Not on file  Occupational History  . Not on file  Social Needs  . Financial resource strain: Not on file  . Food insecurity    Worry: Not on file    Inability: Not on file  . Transportation needs    Medical: Not on file    Non-medical: Not on file  Tobacco Use  . Smoking status: Former Smoker    Years: 25.00    Types: Cigars    Quit date: 06/11/2018    Years since quitting: 0.8  . Smokeless tobacco: Never Used  . Tobacco comment: occ.   Substance and Sexual Activity  . Alcohol use: Yes    Alcohol/week: 1.0 standard drinks    Types: 1 Cans of beer per week    Comment: occ.  . Drug use: Never  . Sexual activity: Not on file  Lifestyle  . Physical activity    Days per week: Not on file    Minutes per session: Not on file  . Stress: Not on file  Relationships  . Social Herbalist on phone: Not on file    Gets together: Not on file    Attends religious service: Not on file    Active member of club or organization: Not on file    Attends meetings of clubs or organizations: Not on file    Relationship status: Not on file  . Intimate partner violence    Fear of current or ex partner: Not on file    Emotionally abused: Not on file    Physically abused: Not on file    Forced sexual activity: Not on file  Other Topics Concern  . Not on file  Social History Narrative  . Not on file    FAMILY HISTORY: Family History  Problem Relation Age of Onset  . Diabetes Mother   . Heart failure Mother   . Cancer Father        liver  . Cancer Paternal Grandfather        does not know what kind  . Cancer Maternal Aunt        breast (half sister) pt mother was adopted  . Colon cancer Neg Hx   . Thyroid disease Neg Hx      ALLERGIES:  has No Known Allergies.  MEDICATIONS:  Current Outpatient Medications  Medication Sig Dispense Refill  . Ascorbic Acid (VITAMIN C ADULT GUMMIES) 125 MG CHEW Chew by mouth.    . fexofenadine (ALLEGRA) 180 MG tablet Take 180 mg by mouth daily.    . folic acid (FOLVITE) 765 MCG tablet Take 1 tablet (400 mcg total) by mouth daily. 90 tablet 1  . mesalamine (CANASA) 1000 MG suppository Place 1 suppository (1,000 mg total) rectally at bedtime. 30 suppository 2  . mesalamine (LIALDA) 1.2 g EC tablet Take 2 tablets (2.4 g total) by mouth daily with breakfast. 60 tablet 3  . Calcium-Phosphorus-Vitamin D (CALCIUM/VITAMIN D3/ADULT GUMMY PO) Take by mouth.     No current facility-administered medications for this visit.      PHYSICAL EXAMINATION: ECOG PERFORMANCE STATUS: 0 - Asymptomatic Vitals:   04/10/19 1018  BP: (!) 148/78  Pulse: 82  Resp: 16  Temp: (!) 95.1 F (35.1 C)   Filed Weights  04/10/19 1018  Weight: 153 lb 3.2 oz (69.5 kg)    Physical Exam Constitutional:      General: He is not in acute distress. HENT:     Head: Normocephalic and atraumatic.  Eyes:     General: No scleral icterus.    Pupils: Pupils are equal, round, and reactive to light.  Neck:     Musculoskeletal: Normal range of motion and neck supple.  Cardiovascular:     Rate and Rhythm: Normal rate and regular rhythm.     Heart sounds: Normal heart sounds.  Pulmonary:     Effort: Pulmonary effort is normal. No respiratory distress.     Breath sounds: No wheezing.  Abdominal:     General: Bowel sounds are normal. There is no distension.     Palpations: Abdomen is soft. There is no mass.     Tenderness: There is no abdominal tenderness.  Musculoskeletal: Normal range of motion.        General: No deformity.  Skin:    General: Skin is warm and dry.     Findings: No erythema or rash.  Neurological:     Mental Status: He is alert and oriented to person, place, and time.     Cranial  Nerves: No cranial nerve deficit.     Coordination: Coordination normal.  Psychiatric:        Behavior: Behavior normal.        Thought Content: Thought content normal.      LABORATORY DATA:  I have reviewed the data as listed Lab Results  Component Value Date   WBC 7.8 04/11/2019   HGB 12.4 (L) 04/11/2019   HCT 41.9 04/11/2019   MCV 76.0 (L) 04/11/2019   PLT 256 04/11/2019   Recent Labs    04/17/18 1019 12/19/18 1457 04/11/19 0943  NA 144 141 139  K 4.4 4.3 4.0  CL 103 102 104  CO2 26 25 29   GLUCOSE 85 94 97  BUN 13 17 15   CREATININE 1.04 0.92 1.02  CALCIUM 9.7 9.3 9.4  GFRNONAA 89 103 >60  GFRAA 103 119 >60  PROT 6.8 6.9 8.0  ALBUMIN 4.5 4.7 4.7  AST 17 14 17   ALT 9 9 12   ALKPHOS 52 48 45  BILITOT 0.2 0.2 0.4   Iron/TIBC/Ferritin/ %Sat    Component Value Date/Time   IRON 62 04/11/2019 0944   IRON 67 11/08/2017 1113   TIBC 294 04/11/2019 0944   TIBC 239 (L) 11/08/2017 1113   FERRITIN 278 04/11/2019 0944   FERRITIN 504 (H) 11/08/2017 1113   IRONPCTSAT 21 04/11/2019 0944   IRONPCTSAT 28 11/08/2017 1113        ASSESSMENT & PLAN:  1. Alpha thalassemia minor   2. Ulcerative colitis with complication, unspecified location (HCC)    Microcytic anemia, secondary to alpha thalassemia minor. Labs are reviewed and discussed with patient and mom. Hemoglobin has been stable.  MCV 76 stable. Iron panels were added to today's blood work and was available after clinic. Iron panel shows adequate iron stores.  No iron deficiency. I recommend patient to continue over-the-counter folic acid 250 MCG daily.  Ulcerative colitis, currently on mesalamine.  Symptoms are controlled well.  Recommend patient continue follow-up with gastroenterology.   Follow-up in 1 year  Orders Placed This Encounter  Procedures  . Ferritin    Standing Status:   Future    Number of Occurrences:   1    Standing Expiration Date:   04/10/2020  .  Iron and TIBC    Standing Status:    Future    Number of Occurrences:   1    Standing Expiration Date:   04/10/2020  . CBC with Differential    Standing Status:   Future    Standing Expiration Date:   04/10/2020  . Comprehensive metabolic panel    Standing Status:   Future    Standing Expiration Date:   04/10/2020  . Ferritin    Standing Status:   Future    Standing Expiration Date:   04/10/2020  . Iron and TIBC    Standing Status:   Future    Standing Expiration Date:   04/10/2020  . Return of visit: 1 year  Earlie Server, MD, PhD Hematology Oncology [facility] Pager- [phone] 04/11/2019

## 2019-04-11 NOTE — Progress Notes (Signed)
Pre-assessment done yesterday for MD visit.

## 2019-05-15 ENCOUNTER — Encounter: Payer: Self-pay | Admitting: Gastroenterology

## 2019-05-15 ENCOUNTER — Ambulatory Visit (INDEPENDENT_AMBULATORY_CARE_PROVIDER_SITE_OTHER): Payer: Medicare Other | Admitting: Gastroenterology

## 2019-05-15 ENCOUNTER — Encounter (INDEPENDENT_AMBULATORY_CARE_PROVIDER_SITE_OTHER): Payer: Self-pay

## 2019-05-15 ENCOUNTER — Other Ambulatory Visit: Payer: Self-pay

## 2019-05-15 ENCOUNTER — Telehealth: Payer: Self-pay

## 2019-05-15 VITALS — BP 134/85 | HR 75 | Temp 97.6°F | Wt 166.5 lb

## 2019-05-15 DIAGNOSIS — K5100 Ulcerative (chronic) pancolitis without complications: Secondary | ICD-10-CM

## 2019-05-15 MED ORDER — MESALAMINE 1.2 G PO TBEC: 2.4000 g | DELAYED_RELEASE_TABLET | Freq: Every day | ORAL | 5 refills | Status: DC

## 2019-05-15 NOTE — Progress Notes (Signed)
[name], MD [address]  [place]  [place], [place] [address]  Main: [phone]  Fax: [phone]   Primary Care Physician: Elba Barman, MD   Chief Complaint  Patient presents with  . Anemia    HPI: Cameron Payne is a 43 y.o. adult here for follow-up of ulcerative colitis.  Reports 1-2 formed bowel movements a day.  No blood.  States he watches the stool carefully and has not seen any blood.  Compliant with Lialda 2.4 g daily and mesalamine suppository  The patient denies abdominal or flank pain, anorexia, nausea or vomiting, dysphagia, change in bowel habits or black or bloody stools or weight loss.  No joint pain or vision changes  last colonoscopy in August 2020 showed erythematous mucosa throughout the colon and was biopsied.  Biopsy showed mild to moderate chronic active colitis in the cecum.  And mild chronic active colitis of the rest of the colon.  Initially seen in clinic: November 01, 2017  Presenting symptom in clinic:Intermittent bright red blood per rectum. Mother noted weight loss at home. Pertinentinitiallab findings: Fecal calprotectin mildly elevated at 126  Index colonoscopy: November 22, 2017 Colonoscopy findings: Impression: - The examination was suspicious for rectal ulcerative  colitis ulcerative colitis. - The examined portion of the ileum was normal.  Biopsied. - Erythematous mucosa at the appendiceal orifice.  Biopsied. - Patchy mild mucosal changes were found in the rectum,  rule out ulcerative colitis. Biopsied. - The sigmoid colon, descending colon, transverse  colon, ascending colon and cecum are normal. Biopsied. - The distal rectum and anal verge are normal on    retroflexion view.  EGD: November 22, 2017 Impression: - White nummular lesions in esophageal mucosa.  Brushings performed. - Normal stomach. - Normal duodenal bulb, second portion of the duodenum  and examined duodenum. Biopsied.   Surgical Pathology    DIAGNOSIS:  A. DUODENUM, SECOND PORTION END BULB; COLD BIOPSY:  - DUODENAL MUCOSA WITH PRESERVED VILLOUS ARCHITECTURE AND FOCAL  BRUNNER'S GLAND HYPERPLASIA.  - NEGATIVE FOR INTRA-EPITHELIAL LYMPHOCYTOSIS, DYSPLASIA AND MALIGNANCY.   B. TERMINAL ILEUM; COLD BIOPSY:  - SMALL BOWEL MUCOSA WITH INTACT VILLOUS ARCHITECTURE.  - NEGATIVE FOR INTRAEPITHELIAL LYMPHOCYTOSIS, DYSPLASIA AND MALIGNANCY.   C. COLON, APPENDICEAL ORIFICE; COLD BIOPSY:  - MILD ACTIVE COLITIS.  - NEGATIVE FOR DYSPLASIA AND MALIGNANCY.   D. COLON, CECUM AND ASCENDING; COLD BIOPSY:  - MUCOSAL EDEMA AND PROMINENT LYMPHOID AGGREGATES.  - NEGATIVE FOR DYSPLASIA AND MALIGNANCY.   E. TRANSVERSE COLON; COLD BIOPSY:  - MUCOSAL HEMORRHAGE AND PROMINENT LYMPHOID AGGREGATES.  - NEGATIVE FOR DYSPLASIA AND MALIGNANCY.   F. COLON, DESCENDING AND SIGMOID; COLD BIOPSY:  - MUCOSAL HEMORRHAGE.  - NEGATIVE FOR DYSPLASIA AND MALIGNANCY.   G. RECTUM; COLD BIOPSY:  - MODERATE ACTIVE COLITIS/PROCTITIS WITH ARCHITECTURAL FEATURES OF  CHRONICITY.  - NEGATIVE FOR DYSPLASIA AND MALIGNANCY.   Note: Sections of rectum show active colitis in form of cryptitis and  crypt abscess formation. Architectural features of chronicity are  present and include basal plasmacytosis, crypt dropout and crypt  irregularities. The differential diagnosis includes infectious colitis,  ischemia, drug (NSAIDs vs other), and idiopathic inflammatory bowel  disease (favored given clinical impression). Clinical correlation is  required.   ADDENDUM:  Sections of part C  (appendiceal orifice) are re-reviewed at the  clinician's request. Mild crypt disarray and focal drop out is seen,  suggestive of chronicity. Negative for infectious agents. Clinical  correlation is required regarding if the location sampled  represents an  area of acute appendicitis. In conjunction with findings in the rectum,  the differential diagnosis includes infectious colitis, ischemia, drug  (NSAIDs vs other), biopsy adjacent to an inflamed diverticulum, and  early idiopathic inflammatory bowel disease. Clinical correlation is  required.        Current Outpatient Medications  Medication Sig Dispense Refill  . Ascorbic Acid (VITAMIN C ADULT GUMMIES) 125 MG CHEW Chew by mouth.    . Calcium-Phosphorus-Vitamin D (CALCIUM/VITAMIN D3/ADULT GUMMY PO) Take by mouth.    . fexofenadine (ALLEGRA) 180 MG tablet Take 180 mg by mouth daily.    . folic acid (FOLVITE) 161 MCG tablet Take 1 tablet (400 mcg total) by mouth daily. 90 tablet 1  . mesalamine (CANASA) 1000 MG suppository Place 1 suppository (1,000 mg total) rectally at bedtime. 30 suppository 2  . mesalamine (LIALDA) 1.2 g EC tablet Take 2 tablets (2.4 g total) by mouth daily with breakfast. 60 tablet 5   No current facility-administered medications for this visit.    Allergies as of 05/15/2019  . (No Known Allergies)    ROS:  General: Negative for anorexia, weight loss, fever, chills, fatigue, weakness. ENT: Negative for hoarseness, difficulty swallowing , nasal congestion. CV: Negative for chest pain, angina, palpitations, dyspnea on exertion, peripheral edema.  Respiratory: Negative for dyspnea at rest, dyspnea on exertion, cough, sputum, wheezing.  GI: See history of present illness. GU:  Negative for dysuria, hematuria, urinary incontinence, urinary frequency, nocturnal urination.  Endo: Negative for unusual weight change.    Physical Examination:   BP 134/85 (BP Location: Left Arm, Patient Position: Sitting, Cuff  Size: Normal)   Pulse 75   Temp 97.6 F (36.4 C) (Oral)   Wt 166 lb 8 oz (75.5 kg)   BMI 23.22 kg/m   General: Well-nourished, well-developed in no acute distress.  Eyes: No icterus. Conjunctivae pink. Mouth: Oropharyngeal mucosa moist and pink , no lesions erythema or exudate. Neck: Supple, Trachea midline Abdomen: Bowel sounds are normal, nontender, nondistended, no hepatosplenomegaly or masses, no abdominal bruits or hernia , no rebound or guarding.   Extremities: No lower extremity edema. No clubbing or deformities. Neuro: Alert and oriented x 3.  Grossly intact. Skin: Warm and dry, no jaundice.   Psych: Alert and cooperative, normal mood and affect.   Labs: CMP     Component Value Date/Time   NA 139 04/11/2019 0943   NA 141 12/19/2018 1457   NA 133 (L) 07/05/2013 0747   K 4.0 04/11/2019 0943   K 3.8 07/05/2013 0747   CL 104 04/11/2019 0943   CL 100 07/05/2013 0747   CO2 29 04/11/2019 0943   CO2 32 07/05/2013 0747   GLUCOSE 97 04/11/2019 0943   GLUCOSE 94 07/05/2013 0747   BUN 15 04/11/2019 0943   BUN 17 12/19/2018 1457   BUN 10 07/05/2013 0747   CREATININE 1.02 04/11/2019 0943   CREATININE 1.05 07/05/2013 0747   CALCIUM 9.4 04/11/2019 0943   CALCIUM 9.4 07/05/2013 0747   PROT 8.0 04/11/2019 0943   PROT 6.9 12/19/2018 1457   ALBUMIN 4.7 04/11/2019 0943   ALBUMIN 4.7 12/19/2018 1457   AST 17 04/11/2019 0943   ALT 12 04/11/2019 0943   ALKPHOS 45 04/11/2019 0943   BILITOT 0.4 04/11/2019 0943   BILITOT 0.2 12/19/2018 1457   GFRNONAA >60 04/11/2019 0943   GFRNONAA >60 07/05/2013 0747   GFRAA >60 04/11/2019 0943   GFRAA >60 07/05/2013 0747   Lab Results  Component Value  Date   WBC 7.8 04/11/2019   HGB 12.4 (L) 04/11/2019   HCT 41.9 04/11/2019   MCV 76.0 (L) 04/11/2019   PLT 256 04/11/2019    Imaging Studies: No results found.  Assessment and Plan:   Cameron Payne is a 43 y.o. y/o adult here for follow-up of pancolonic ulcerative  colitis  Patient remains in clinical remission with biopsies showing mild chronic active colitis  Continue Lialda 2.4 g a day and mesalamine suppositories  Labs from last month reviewed and are reassuring with normal kidney function and baseline CBC  Continue follow-up with Dr. Tasia Catchings for thalassemia  Will check CRP and fecal calprotectin  Dr [name]

## 2019-05-15 NOTE — Telephone Encounter (Signed)
Called and informed patient that the provider has order a stool sample and he can go pick this up at any [facility] location and then return it to the [facility]. Patient states he will go pick it up this week

## 2019-05-16 ENCOUNTER — Other Ambulatory Visit: Payer: Self-pay

## 2019-05-16 DIAGNOSIS — K5100 Ulcerative (chronic) pancolitis without complications: Secondary | ICD-10-CM

## 2019-05-23 LAB — CALPROTECTIN, FECAL: Calprotectin, Fecal: 149 ug/g — ABNORMAL HIGH (ref 0–120)

## 2019-05-27 ENCOUNTER — Other Ambulatory Visit: Payer: Self-pay

## 2019-05-27 MED ORDER — MESALAMINE 1000 MG RE SUPP: 1000.0000 mg | Freq: Every day | RECTAL | 2 refills | Status: DC

## 2019-05-27 NOTE — Telephone Encounter (Signed)
Last office visit 05/15/2019 Ulcerative pancolitis  Last refill 01/21/2019 2 refills Mesalamine Suppositories

## 2019-10-23 ENCOUNTER — Other Ambulatory Visit: Payer: Self-pay

## 2019-10-23 ENCOUNTER — Ambulatory Visit (INDEPENDENT_AMBULATORY_CARE_PROVIDER_SITE_OTHER): Payer: Medicare Other | Admitting: Gastroenterology

## 2019-10-23 ENCOUNTER — Encounter: Payer: Self-pay | Admitting: Gastroenterology

## 2019-10-23 VITALS — BP 142/79 | HR 75 | Temp 97.5°F | Ht 71.0 in | Wt 155.6 lb

## 2019-10-23 DIAGNOSIS — K5100 Ulcerative (chronic) pancolitis without complications: Secondary | ICD-10-CM

## 2019-10-23 MED ORDER — MESALAMINE 1.2 G PO TBEC: 2.4000 g | DELAYED_RELEASE_TABLET | Freq: Every day | ORAL | 5 refills | Status: DC

## 2019-10-23 NOTE — Patient Instructions (Signed)
Please do not forget to take your Flu injection every year.

## 2019-10-24 LAB — CBC
Hematocrit: 41.6 % (ref 37.5–51.0)
Hemoglobin: 12.2 g/dL — ABNORMAL LOW (ref 13.0–17.7)
MCH: 21.9 pg — ABNORMAL LOW (ref 26.6–33.0)
MCHC: 29.3 g/dL — ABNORMAL LOW (ref 31.5–35.7)
MCV: 75 fL — ABNORMAL LOW (ref 79–97)
Platelets: 230 10*3/uL (ref 150–450)
RBC: 5.57 x10E6/uL (ref 4.14–5.80)
RDW: 12.9 % (ref 11.6–15.4)
WBC: 5.6 10*3/uL (ref 3.4–10.8)

## 2019-10-24 LAB — COMPREHENSIVE METABOLIC PANEL
ALT: 9 IU/L (ref 0–44)
AST: 16 IU/L (ref 0–40)
Albumin/Globulin Ratio: 1.8 (ref 1.2–2.2)
Albumin: 4.5 g/dL (ref 4.0–5.0)
Alkaline Phosphatase: 54 IU/L (ref 48–121)
BUN/Creatinine Ratio: 11 (ref 9–20)
BUN: 11 mg/dL (ref 6–24)
Bilirubin Total: 0.3 mg/dL (ref 0.0–1.2)
CO2: 23 mmol/L (ref 20–29)
Calcium: 9.2 mg/dL (ref 8.7–10.2)
Chloride: 105 mmol/L (ref 96–106)
Creatinine, Ser: 1 mg/dL (ref 0.76–1.27)
GFR calc Af Amer: 107 mL/min/{1.73_m2} (ref >59)
GFR calc non Af Amer: 92 mL/min/{1.73_m2} (ref >59)
Globulin, Total: 2.5 g/dL (ref 1.5–4.5)
Glucose: 97 mg/dL (ref 65–99)
Potassium: 4.5 mmol/L (ref 3.5–5.2)
Sodium: 143 mmol/L (ref 134–144)
Total Protein: 7 g/dL (ref 6.0–8.5)

## 2019-10-24 LAB — C-REACTIVE PROTEIN: CRP: <1 mg/L (ref 0–10)

## 2019-10-24 NOTE — Progress Notes (Signed)
Cameron Antigua, MD [address]  [place]  [place], [place] [address]  Main: [phone]  Fax: [phone]   Primary Care Physician: Elba Barman, MD   Chief Complaint  Patient presents with  . Ulcerative pancolitis    Patient stated that he had been doing well with no complaints.    HPI: Cameron Payne is a 44 y.o. adult here for follow-up of ulcerative colitis.  Reports 1-2 formed bowel movements a day.  No blood.  States he watches the stool carefully and has not seen any blood.  Compliant with Lialda 2.4 g daily and mesalamine suppository  The patient denies abdominal or flank pain, anorexia, nausea or vomiting, dysphagia, change in bowel habits or black or bloody stools or weight loss.  No joint pain or vision changes  last colonoscopy in August 2020 showed erythematous mucosa throughout the colon and was biopsied.  Biopsy showed mild to moderate chronic active colitis in the cecum.  And mild chronic active colitis of the rest of the colon.  Initially seen in clinic: November 01, 2017  Presenting symptom in clinic:Intermittent bright red blood per rectum. Mother noted weight loss at home. Pertinentinitiallab findings: Fecal calprotectin mildly elevated at 126  Index colonoscopy: November 22, 2017 Colonoscopy findings: Impression: - The examination was suspicious for rectal ulcerative  colitis ulcerative colitis. - The examined portion of the ileum was normal.  Biopsied. - Erythematous mucosa at the appendiceal orifice.  Biopsied. - Patchy mild mucosal changes were found in the rectum,  rule out ulcerative colitis. Biopsied. - The sigmoid colon, descending colon, transverse  colon, ascending colon and cecum are normal.  Biopsied. - The distal rectum and anal verge are normal on  retroflexion view.  EGD: November 22, 2017 Impression: - White nummular lesions in esophageal mucosa.  Brushings performed. - Normal stomach. - Normal duodenal bulb, second portion of the duodenum  and examined duodenum. Biopsied.   Surgical Pathology    DIAGNOSIS:  A. DUODENUM, SECOND PORTION END BULB; COLD BIOPSY:  - DUODENAL MUCOSA WITH PRESERVED VILLOUS ARCHITECTURE AND FOCAL  BRUNNER'S GLAND HYPERPLASIA.  - NEGATIVE FOR INTRA-EPITHELIAL LYMPHOCYTOSIS, DYSPLASIA AND MALIGNANCY.   B. TERMINAL ILEUM; COLD BIOPSY:  - SMALL BOWEL MUCOSA WITH INTACT VILLOUS ARCHITECTURE.  - NEGATIVE FOR INTRAEPITHELIAL LYMPHOCYTOSIS, DYSPLASIA AND MALIGNANCY.   C. COLON, APPENDICEAL ORIFICE; COLD BIOPSY:  - MILD ACTIVE COLITIS.  - NEGATIVE FOR DYSPLASIA AND MALIGNANCY.   D. COLON, CECUM AND ASCENDING; COLD BIOPSY:  - MUCOSAL EDEMA AND PROMINENT LYMPHOID AGGREGATES.  - NEGATIVE FOR DYSPLASIA AND MALIGNANCY.   E. TRANSVERSE COLON; COLD BIOPSY:  - MUCOSAL HEMORRHAGE AND PROMINENT LYMPHOID AGGREGATES.  - NEGATIVE FOR DYSPLASIA AND MALIGNANCY.   F. COLON, DESCENDING AND SIGMOID; COLD BIOPSY:  - MUCOSAL HEMORRHAGE.  - NEGATIVE FOR DYSPLASIA AND MALIGNANCY.   G. RECTUM; COLD BIOPSY:  - MODERATE ACTIVE COLITIS/PROCTITIS WITH ARCHITECTURAL FEATURES OF  CHRONICITY.  - NEGATIVE FOR DYSPLASIA AND MALIGNANCY.   Note: Sections of rectum show active colitis in form of cryptitis and  crypt abscess formation. Architectural features of chronicity are  present and include basal plasmacytosis, crypt dropout and crypt  irregularities. The differential diagnosis includes infectious colitis,  ischemia, drug (NSAIDs vs other), and idiopathic inflammatory bowel  disease (favored given clinical  impression). Clinical correlation is  required.   ADDENDUM:  Sections of part C (appendiceal orifice) are re-reviewed at the  clinician's request. Mild crypt disarray and focal drop out is seen,  suggestive of chronicity.  Negative for infectious agents. Clinical  correlation is required regarding if the location sampled represents an  area of acute appendicitis. In conjunction with findings in the rectum,  the differential diagnosis includes infectious colitis, ischemia, drug  (NSAIDs vs other), biopsy adjacent to an inflamed diverticulum, and  early idiopathic inflammatory bowel disease. Clinical correlation is  required.       Current Outpatient Medications  Medication Sig Dispense Refill  . Ascorbic Acid (VITAMIN C ADULT GUMMIES) 125 MG CHEW Chew by mouth.    . Calcium-Phosphorus-Vitamin D (CALCIUM/VITAMIN D3/ADULT GUMMY PO) Take by mouth.    . fexofenadine (ALLEGRA) 180 MG tablet Take 180 mg by mouth daily.    . folic acid (FOLVITE) 196 MCG tablet Take 1 tablet (400 mcg total) by mouth daily. 90 tablet 1  . mesalamine (LIALDA) 1.2 g EC tablet Take 2 tablets (2.4 g total) by mouth daily with breakfast. 60 tablet 5   No current facility-administered medications for this visit.    Allergies as of 10/23/2019  . (No Known Allergies)    ROS:  General: Negative for anorexia, weight loss, fever, chills, fatigue, weakness. ENT: Negative for hoarseness, difficulty swallowing , nasal congestion. CV: Negative for chest pain, angina, palpitations, dyspnea on exertion, peripheral edema.  Respiratory: Negative for dyspnea at rest, dyspnea on exertion, cough, sputum, wheezing.  GI: See history of present illness. GU:  Negative for dysuria, hematuria, urinary incontinence, urinary frequency, nocturnal urination.  Endo: Negative for unusual weight change.    Physical Examination:   BP (!) 142/79   Pulse 75   Temp (!) 97.5 F (36.4 C) (Oral)   Ht 5' 11"  (1.803 m)   Wt 155 lb 9.6  oz (70.6 kg)   BMI 21.70 kg/m   General: Well-nourished, well-developed in no acute distress.  Eyes: No icterus. Conjunctivae pink. Mouth: Oropharyngeal mucosa moist and pink , no lesions erythema or exudate. Neck: Supple, Trachea midline Abdomen: Bowel sounds are normal, nontender, nondistended, no hepatosplenomegaly or masses, no abdominal bruits or hernia , no rebound or guarding.   Extremities: No lower extremity edema. No clubbing or deformities. Neuro: Alert and oriented x 3.  Grossly intact. Skin: Warm and dry, no jaundice.   Psych: Alert and cooperative, normal mood and affect.   Labs: Reviewed Imaging Studies: No results found.  Assessment and Plan:   Cameron Payne is a 44 y.o. y/o adult here for follow-up of pancolonic ulcerative colitis, in clinical remission  Continue Lialda 2.4 g daily Obtain repeat labs at this time  No extraintestinal manifestations  Patient advised to have a flu shot annually and he verbalized understanding    Dr Cameron Payne

## 2019-11-01 LAB — CALPROTECTIN, FECAL: Calprotectin, Fecal: 483 ug/g — ABNORMAL HIGH (ref 0–120)

## 2019-11-12 ENCOUNTER — Telehealth: Payer: Self-pay

## 2019-11-12 NOTE — Telephone Encounter (Signed)
-----   Message from Virgel Manifold, MD sent at 11/12/2019 11:05 AM EDT ----- Cameron Payne please let the patient know, his fecal calprotectin was high. I recommend repeating this in 2 more weeks. If still high we would need to proceed with colonoscopy

## 2019-11-12 NOTE — Telephone Encounter (Signed)
Called patient and had to leave him a voicemail to return my call.

## 2019-11-13 NOTE — Telephone Encounter (Signed)
Called patient again and he did not answer. Therefore, I left him another voicemail. I will go ahead and send him a letter and hopefully he will call us back.

## 2019-11-19 ENCOUNTER — Telehealth: Payer: Self-pay

## 2019-11-19 DIAGNOSIS — K625 Hemorrhage of anus and rectum: Secondary | ICD-10-CM

## 2019-11-19 NOTE — Telephone Encounter (Signed)
Patient's mother-Wanda called back after receiving our letter to contact us. I told Mrs. Mariann Laster that his son's stool showed elevated fecal calprotectin. Therefore, Dr. Bonna Gains wanted to recheck his stools again and Mrs. Mariann Laster stated that she would come in tomorrow to pick up the materials she needs to help her son collect his stool samples.

## 2019-11-30 LAB — CALPROTECTIN, FECAL: Calprotectin, Fecal: 109 ug/g (ref 0–120)

## 2020-01-22 ENCOUNTER — Ambulatory Visit (INDEPENDENT_AMBULATORY_CARE_PROVIDER_SITE_OTHER): Payer: Medicare Other | Admitting: Gastroenterology

## 2020-01-22 ENCOUNTER — Encounter: Payer: Self-pay | Admitting: Gastroenterology

## 2020-01-22 ENCOUNTER — Other Ambulatory Visit: Payer: Self-pay

## 2020-01-22 DIAGNOSIS — K5100 Ulcerative (chronic) pancolitis without complications: Secondary | ICD-10-CM

## 2020-01-22 MED ORDER — MESALAMINE 1.2 G PO TBEC: 4.8000 g | DELAYED_RELEASE_TABLET | Freq: Every day | ORAL | 1 refills | Status: DC

## 2020-01-22 NOTE — Progress Notes (Signed)
Cameron Antigua, MD [address]  [place]  [place],  [address]  Main: [phone]  Fax: [phone]   Primary Care Physician: Elba Barman, MD   Chief Complaint  Patient presents with  . ulcerative pancolitis    HPI: Cameron Payne is a 44 y.o. adult with history of ulcerative pancolitis here for follow-up.  Patient reporting 2-3 loose bowel movements a day.  No blood in stool.  Good appetite.  No fever or chills.  No weight loss.  No nausea or vomiting.  No abdominal pain.  Patient's fecal calprotectin was elevated at 483 on October 29, 2019.  This was repeated on June 30 and had shown improvement but borderline elevated at 109.  Previous history: last colonoscopy in August 2020 showed erythematous mucosa throughout the colon and was biopsied.  Biopsy showed mild to moderate chronic active colitis in the cecum.  And mild chronic active colitis of the rest of the colon.  Initially seen in clinic: November 01, 2017  Presenting symptom in clinic:Intermittent bright red blood per rectum. Mother noted weight loss at home. Pertinentinitiallab findings: Fecal calprotectin mildly elevated at 126  Index colonoscopy: November 22, 2017 Colonoscopy findings: Impression: - The examination was suspicious for rectal ulcerative  colitis ulcerative colitis. - The examined portion of the ileum was normal.  Biopsied. - Erythematous mucosa at the appendiceal orifice.  Biopsied. - Patchy mild mucosal changes were found in the rectum,  rule out ulcerative colitis. Biopsied. - The sigmoid colon, descending colon, transverse  colon, ascending colon and cecum are normal. Biopsied. - The distal rectum and anal verge are normal on   retroflexion view.  EGD: November 22, 2017 Impression: - White nummular lesions in esophageal mucosa.  Brushings performed. - Normal stomach. - Normal duodenal bulb, second portion of the duodenum  and examined duodenum. Biopsied.   Surgical Pathology    DIAGNOSIS:  A. DUODENUM, SECOND PORTION END BULB; COLD BIOPSY:  - DUODENAL MUCOSA WITH PRESERVED VILLOUS ARCHITECTURE AND FOCAL  BRUNNER'S GLAND HYPERPLASIA.  - NEGATIVE FOR INTRA-EPITHELIAL LYMPHOCYTOSIS, DYSPLASIA AND MALIGNANCY.   B. TERMINAL ILEUM; COLD BIOPSY:  - SMALL BOWEL MUCOSA WITH INTACT VILLOUS ARCHITECTURE.  - NEGATIVE FOR INTRAEPITHELIAL LYMPHOCYTOSIS, DYSPLASIA AND MALIGNANCY.   C. COLON, APPENDICEAL ORIFICE; COLD BIOPSY:  - MILD ACTIVE COLITIS.  - NEGATIVE FOR DYSPLASIA AND MALIGNANCY.   D. COLON, CECUM AND ASCENDING; COLD BIOPSY:  - MUCOSAL EDEMA AND PROMINENT LYMPHOID AGGREGATES.  - NEGATIVE FOR DYSPLASIA AND MALIGNANCY.   E. TRANSVERSE COLON; COLD BIOPSY:  - MUCOSAL HEMORRHAGE AND PROMINENT LYMPHOID AGGREGATES.  - NEGATIVE FOR DYSPLASIA AND MALIGNANCY.   F. COLON, DESCENDING AND SIGMOID; COLD BIOPSY:  - MUCOSAL HEMORRHAGE.  - NEGATIVE FOR DYSPLASIA AND MALIGNANCY.   G. RECTUM; COLD BIOPSY:  - MODERATE ACTIVE COLITIS/PROCTITIS WITH ARCHITECTURAL FEATURES OF  CHRONICITY.  - NEGATIVE FOR DYSPLASIA AND MALIGNANCY.   Note: Sections of rectum show active colitis in form of cryptitis and  crypt abscess formation. Architectural features of chronicity are  present and include basal plasmacytosis, crypt dropout and crypt  irregularities. The differential diagnosis includes infectious colitis,  ischemia, drug (NSAIDs vs other), and idiopathic inflammatory bowel  disease (favored given clinical impression). Clinical correlation is  required.   ADDENDUM:  Sections of part C  (appendiceal orifice) are re-reviewed at the  clinician's request. Mild crypt disarray and focal drop out is seen,  suggestive of chronicity. Negative for infectious agents. Clinical  correlation is required regarding if the location  sampled represents an  area of acute appendicitis. In conjunction with findings in the rectum,  the differential diagnosis includes infectious colitis, ischemia, drug  (NSAIDs vs other), biopsy adjacent to an inflamed diverticulum, and  early idiopathic inflammatory bowel disease. Clinical correlation is  required.       Current Outpatient Medications  Medication Sig Dispense Refill  . Ascorbic Acid (VITAMIN C ADULT GUMMIES) 125 MG CHEW Chew by mouth.    . Calcium-Phosphorus-Vitamin D (CALCIUM/VITAMIN D3/ADULT GUMMY PO) Take by mouth.    . ELDERBERRY PO Take 1 capsule by mouth 1 day or 1 dose.    . fexofenadine (ALLEGRA) 180 MG tablet Take 180 mg by mouth daily.    . folic acid (FOLVITE) 867 MCG tablet Take 1 tablet (400 mcg total) by mouth daily. 90 tablet 1  . mesalamine (LIALDA) 1.2 g EC tablet Take 4 tablets (4.8 g total) by mouth daily with breakfast. 120 tablet 1   No current facility-administered medications for this visit.    Allergies as of 01/22/2020  . (No Known Allergies)    ROS:  General: Negative for anorexia, weight loss, fever, chills, fatigue, weakness. ENT: Negative for hoarseness, difficulty swallowing , nasal congestion. CV: Negative for chest pain, angina, palpitations, dyspnea on exertion, peripheral edema.  Respiratory: Negative for dyspnea at rest, dyspnea on exertion, cough, sputum, wheezing.  GI: See history of present illness. GU:  Negative for dysuria, hematuria, urinary incontinence, urinary frequency, nocturnal urination.  Endo: Negative for unusual weight change.    Physical Examination:   BP (!) 158/76   Pulse 71   Temp 98.1 F (36.7 C) (Oral)   Ht 5' 11"  (1.803 m)   Wt 156 lb 12.8 oz (71.1 kg)   BMI 21.87  kg/m   General: Well-nourished, well-developed in no acute distress.  Eyes: No icterus. Conjunctivae pink. Mouth: Oropharyngeal mucosa moist and pink , no lesions erythema or exudate. Neck: Supple, Trachea midline Abdomen: Bowel sounds are normal, nontender, nondistended, no hepatosplenomegaly or masses, no abdominal bruits or hernia , no rebound or guarding.   Extremities: No lower extremity edema. No clubbing or deformities. Neuro: Alert and oriented x 3.  Grossly intact. Skin: Warm and dry, no jaundice.   Psych: Alert and cooperative, normal mood and affect.   Labs: CMP     Component Value Date/Time   NA 143 10/23/2019 1602   NA 133 (L) 07/05/2013 0747   K 4.5 10/23/2019 1602   K 3.8 07/05/2013 0747   CL 105 10/23/2019 1602   CL 100 07/05/2013 0747   CO2 23 10/23/2019 1602   CO2 32 07/05/2013 0747   GLUCOSE 97 10/23/2019 1602   GLUCOSE 97 04/11/2019 0943   GLUCOSE 94 07/05/2013 0747   BUN 11 10/23/2019 1602   BUN 10 07/05/2013 0747   CREATININE 1.00 10/23/2019 1602   CREATININE 1.05 07/05/2013 0747   CALCIUM 9.2 10/23/2019 1602   CALCIUM 9.4 07/05/2013 0747   PROT 7.0 10/23/2019 1602   ALBUMIN 4.5 10/23/2019 1602   AST 16 10/23/2019 1602   ALT 9 10/23/2019 1602   ALKPHOS 54 10/23/2019 1602   BILITOT 0.3 10/23/2019 1602   GFRNONAA 92 10/23/2019 1602   GFRNONAA >60 07/05/2013 0747   GFRAA 107 10/23/2019 1602   GFRAA >60 07/05/2013 0747   Lab Results  Component Value Date   WBC 5.6 10/23/2019   HGB 12.2 (L) 10/23/2019   HCT 41.6 10/23/2019   MCV 75 (L) 10/23/2019   PLT 230 10/23/2019  Imaging Studies: No results found.  Assessment and Plan:   RAEF SPRIGG is a 44 y.o. y/o adult here for follow-up of ulcerative pancolitis  Due to borderline elevated fecal calprotectin and pt reporting 2-3 loose stools a day increase Lialda to 4.8 g a day  Repeat labs in 4 weeks  Possible repeat colonoscopy in 3-6 months  Patient states he is receiving his flu  shots once a year  No extraintestinal manifestations    Dr Cameron Payne

## 2020-01-22 NOTE — Patient Instructions (Signed)
Dr. Bonna Gains wants you to have your labs done in 4-6 weeks. You do not have to schedule an appointment for labs but please call the office to make sure that we have a lab tech.

## 2020-03-03 ENCOUNTER — Encounter: Payer: Self-pay | Admitting: Gastroenterology

## 2020-03-03 ENCOUNTER — Ambulatory Visit (INDEPENDENT_AMBULATORY_CARE_PROVIDER_SITE_OTHER): Payer: Medicare Other | Admitting: Gastroenterology

## 2020-03-03 ENCOUNTER — Other Ambulatory Visit: Payer: Self-pay

## 2020-03-03 DIAGNOSIS — K5100 Ulcerative (chronic) pancolitis without complications: Secondary | ICD-10-CM | POA: Diagnosis not present

## 2020-03-03 MED ORDER — MESALAMINE 1.2 G PO TBEC: 4.8000 g | DELAYED_RELEASE_TABLET | Freq: Every day | ORAL | 2 refills | Status: DC

## 2020-03-03 MED ORDER — MESALAMINE 1000 MG RE SUPP: 1000.0000 mg | Freq: Every day | RECTAL | 2 refills | Status: DC

## 2020-03-04 ENCOUNTER — Other Ambulatory Visit: Payer: Self-pay | Admitting: Gastroenterology

## 2020-03-04 LAB — C-REACTIVE PROTEIN: CRP: <1 mg/L (ref 0–10)

## 2020-03-04 LAB — COMPREHENSIVE METABOLIC PANEL
ALT: 9 IU/L (ref 0–44)
AST: 15 IU/L (ref 0–40)
Albumin/Globulin Ratio: 1.8 (ref 1.2–2.2)
Albumin: 4.7 g/dL (ref 4.0–5.0)
Alkaline Phosphatase: 55 IU/L (ref 44–121)
BUN/Creatinine Ratio: 11 (ref 9–20)
BUN: 11 mg/dL (ref 6–24)
Bilirubin Total: <0.2 mg/dL (ref 0.0–1.2)
CO2: 20 mmol/L (ref 20–29)
Calcium: 9.7 mg/dL (ref 8.7–10.2)
Chloride: 104 mmol/L (ref 96–106)
Creatinine, Ser: 1.01 mg/dL (ref 0.76–1.27)
GFR calc Af Amer: 106 mL/min/{1.73_m2} (ref >59)
GFR calc non Af Amer: 91 mL/min/{1.73_m2} (ref >59)
Globulin, Total: 2.6 g/dL (ref 1.5–4.5)
Glucose: 85 mg/dL (ref 65–99)
Potassium: 4.4 mmol/L (ref 3.5–5.2)
Sodium: 143 mmol/L (ref 134–144)
Total Protein: 7.3 g/dL (ref 6.0–8.5)

## 2020-03-04 LAB — CBC
Hematocrit: 42.3 % (ref 37.5–51.0)
Hemoglobin: 12.7 g/dL — ABNORMAL LOW (ref 13.0–17.7)
MCH: 22.3 pg — ABNORMAL LOW (ref 26.6–33.0)
MCHC: 30 g/dL — ABNORMAL LOW (ref 31.5–35.7)
MCV: 74 fL — ABNORMAL LOW (ref 79–97)
Platelets: 241 10*3/uL (ref 150–450)
RBC: 5.69 x10E6/uL (ref 4.14–5.80)
RDW: 12.8 % (ref 11.6–15.4)
WBC: 6 10*3/uL (ref 3.4–10.8)

## 2020-03-04 NOTE — Progress Notes (Signed)
[name], MD [address]  [place]  [place], [place] [address]  Main: [phone]  Fax: [phone]   Primary Care Physician: Elba Barman, MD   Chief Complaint  Patient presents with  . Follow-up    ulcerative colitis    HPI: CALEY VOLKERT is a 44 y.o. adult here for follow-up of ulcerative colitis.  Patient is taking 4.8 g of Lialda daily and Canasa suppository every night.  He is compliant with it.  Reports 2 formed to loose bowel movements daily with no blood.  On previous visit lab work was ordered to be done after increasing his Lialda dose but patient did not have this done.  Reports good appetite.  No weight loss.  No fever or chills.  No extraintestinal manifestations.  Accompanied by his mother.  Current Outpatient Medications  Medication Sig Dispense Refill  . Ascorbic Acid (VITAMIN C ADULT GUMMIES) 125 MG CHEW Chew by mouth.    . Calcium-Phosphorus-Vitamin D (CALCIUM/VITAMIN D3/ADULT GUMMY PO) Take by mouth.    . ELDERBERRY PO Take 1 capsule by mouth 1 day or 1 dose.    . fexofenadine (ALLEGRA) 180 MG tablet Take 180 mg by mouth daily.    . folic acid (FOLVITE) 646 MCG tablet Take 1 tablet (400 mcg total) by mouth daily. 90 tablet 1  . mesalamine (CANASA) 1000 MG suppository Place 1 suppository (1,000 mg total) rectally at bedtime. 30 suppository 2  . mesalamine (LIALDA) 1.2 g EC tablet Take 4 tablets (4.8 g total) by mouth daily with breakfast. 120 tablet 2   No current facility-administered medications for this visit.    Allergies as of 03/03/2020  . (No Known Allergies)    ROS:  General: Negative for anorexia, weight loss, fever, chills, fatigue, weakness. ENT: Negative for hoarseness, difficulty swallowing , nasal congestion. CV: Negative for chest pain, angina, palpitations, dyspnea on exertion, peripheral edema.  Respiratory: Negative for dyspnea at rest, dyspnea on exertion, cough, sputum, wheezing.  GI: See history of present  illness. GU:  Negative for dysuria, hematuria, urinary incontinence, urinary frequency, nocturnal urination.  Endo: Negative for unusual weight change.    Physical Examination:   BP (!) 148/75 (BP Location: Left Arm, Patient Position: Sitting, Cuff Size: Normal)   Pulse 73   Temp 98 F (36.7 C) (Oral)   Ht 5' 11"  (1.803 m)   Wt 156 lb 9.6 oz (71 kg)   BMI 21.84 kg/m   General: Well-nourished, well-developed in no acute distress.  Eyes: No icterus. Conjunctivae pink. Mouth: Oropharyngeal mucosa moist and pink , no lesions erythema or exudate. Neck: Supple, Trachea midline Abdomen: Bowel sounds are normal, nontender, nondistended, no hepatosplenomegaly or masses, no abdominal bruits or hernia , no rebound or guarding.   Extremities: No lower extremity edema. No clubbing or deformities. Neuro: Alert and oriented x 3.  Grossly intact. Skin: Warm and dry, no jaundice.   Psych: Alert and cooperative, normal mood and affect.   Labs: CMP     Component Value Date/Time   NA 143 03/03/2020 1556   NA 133 (L) 07/05/2013 0747   K 4.4 03/03/2020 1556   K 3.8 07/05/2013 0747   CL 104 03/03/2020 1556   CL 100 07/05/2013 0747   CO2 20 03/03/2020 1556   CO2 32 07/05/2013 0747   GLUCOSE 85 03/03/2020 1556   GLUCOSE 97 04/11/2019 0943   GLUCOSE 94 07/05/2013 0747   BUN 11 03/03/2020 1556   BUN 10 07/05/2013 0747  CREATININE 1.01 03/03/2020 1556   CREATININE 1.05 07/05/2013 0747   CALCIUM 9.7 03/03/2020 1556   CALCIUM 9.4 07/05/2013 0747   PROT 7.3 03/03/2020 1556   ALBUMIN 4.7 03/03/2020 1556   AST 15 03/03/2020 1556   ALT 9 03/03/2020 1556   ALKPHOS 55 03/03/2020 1556   BILITOT <0.2 03/03/2020 1556   GFRNONAA 91 03/03/2020 1556   GFRNONAA >60 07/05/2013 0747   GFRAA 106 03/03/2020 1556   GFRAA >60 07/05/2013 0747   Lab Results  Component Value Date   WBC 6.0 03/03/2020   HGB 12.7 (L) 03/03/2020   HCT 42.3 03/03/2020   MCV 74 (L) 03/03/2020   PLT 241 03/03/2020     Imaging Studies: No results found.  Assessment and Plan:   OBRYAN RADU is a 44 y.o. y/o adult here for follow-up of ulcerative colitis  Lab work repeated on today's visit is reassuring.  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  This is seen on today's labs, otherwise normal CRP  Fecal calprotectin was ordered and is pending  Depending on results of fecal calprotectin, can consider repeat colonoscopy as necessary, and decreasing his Lialda dose as well  Dr [name]

## 2020-03-06 LAB — CALPROTECTIN, FECAL: Calprotectin, Fecal: 21 ug/g (ref 0–120)

## 2020-03-12 ENCOUNTER — Telehealth: Payer: Self-pay

## 2020-03-12 MED ORDER — MESALAMINE 1.2 G PO TBEC: 3.6000 g | DELAYED_RELEASE_TABLET | Freq: Every day | ORAL | 3 refills | Status: DC

## 2020-03-12 NOTE — Telephone Encounter (Signed)
-----   Message from Virgel Manifold, MD sent at 03/12/2020  1:22 PM EDT ----- Herb Grays please let the patient know, his stool marker is normal.  I would recommend decreasing his Lialda to 3.6 g once a day, which would be 3 pills a day.  Continue the suppository

## 2020-03-12 NOTE — Telephone Encounter (Signed)
Called patient's mother-Wanda Ermalinda Barrios [facility]) and gave her the information stated below. She understood and had no further questions.

## 2020-04-06 ENCOUNTER — Inpatient Hospital Stay (HOSPITAL_BASED_OUTPATIENT_CLINIC_OR_DEPARTMENT_OTHER): Payer: Medicare Other | Admitting: Oncology

## 2020-04-06 ENCOUNTER — Telehealth: Payer: Self-pay

## 2020-04-06 ENCOUNTER — Other Ambulatory Visit: Payer: Self-pay

## 2020-04-06 ENCOUNTER — Encounter: Payer: Self-pay | Admitting: Oncology

## 2020-04-06 ENCOUNTER — Inpatient Hospital Stay: Payer: Medicare Other | Attending: Oncology

## 2020-04-06 VITALS — BP 134/86 | HR 70 | Temp 95.2°F | Resp 16 | Wt 157.9 lb

## 2020-04-06 DIAGNOSIS — Z79899 Other long term (current) drug therapy: Secondary | ICD-10-CM | POA: Insufficient documentation

## 2020-04-06 DIAGNOSIS — K6289 Other specified diseases of anus and rectum: Secondary | ICD-10-CM | POA: Diagnosis not present

## 2020-04-06 DIAGNOSIS — K5190 Ulcerative colitis, unspecified, without complications: Secondary | ICD-10-CM | POA: Insufficient documentation

## 2020-04-06 DIAGNOSIS — D509 Iron deficiency anemia, unspecified: Secondary | ICD-10-CM

## 2020-04-06 DIAGNOSIS — Z809 Family history of malignant neoplasm, unspecified: Secondary | ICD-10-CM | POA: Diagnosis not present

## 2020-04-06 DIAGNOSIS — D563 Thalassemia minor: Secondary | ICD-10-CM | POA: Insufficient documentation

## 2020-04-06 DIAGNOSIS — Z833 Family history of diabetes mellitus: Secondary | ICD-10-CM | POA: Insufficient documentation

## 2020-04-06 DIAGNOSIS — Z8249 Family history of ischemic heart disease and other diseases of the circulatory system: Secondary | ICD-10-CM | POA: Diagnosis not present

## 2020-04-06 DIAGNOSIS — Z800 Family history of malignant neoplasm of digestive organs: Secondary | ICD-10-CM | POA: Diagnosis not present

## 2020-04-06 DIAGNOSIS — Z87891 Personal history of nicotine dependence: Secondary | ICD-10-CM | POA: Insufficient documentation

## 2020-04-06 LAB — COMPREHENSIVE METABOLIC PANEL
ALT: 14 U/L (ref 0–44)
AST: 17 U/L (ref 15–41)
Albumin: 4.3 g/dL (ref 3.5–5.0)
Alkaline Phosphatase: 40 U/L (ref 38–126)
Anion gap: 7 (ref 5–15)
BUN: 20 mg/dL (ref 6–20)
CO2: 26 mmol/L (ref 22–32)
Calcium: 9.1 mg/dL (ref 8.9–10.3)
Chloride: 107 mmol/L (ref 98–111)
Creatinine, Ser: 0.81 mg/dL (ref 0.61–1.24)
GFR, Estimated: >60 mL/min (ref >60)
Glucose, Bld: 78 mg/dL (ref 70–99)
Potassium: 3.8 mmol/L (ref 3.5–5.1)
Sodium: 140 mmol/L (ref 135–145)
Total Bilirubin: 0.5 mg/dL (ref 0.3–1.2)
Total Protein: 7.5 g/dL (ref 6.5–8.1)

## 2020-04-06 LAB — RETIC PANEL
Immature Retic Fract: 7.2 % (ref 2.3–15.9)
RBC.: 5.45 MIL/uL (ref 4.22–5.81)
Retic Count, Absolute: 69.2 10*3/uL (ref 19.0–186.0)
Retic Ct Pct: 1.3 % (ref 0.4–3.1)
Reticulocyte Hemoglobin: 23.2 pg — ABNORMAL LOW (ref >27.9)

## 2020-04-06 LAB — CBC WITH DIFFERENTIAL/PLATELET
Abs Immature Granulocytes: 0.02 10*3/uL (ref 0.00–0.07)
Basophils Absolute: 0 10*3/uL (ref 0.0–0.1)
Basophils Relative: 0 %
Eosinophils Absolute: 0.1 10*3/uL (ref 0.0–0.5)
Eosinophils Relative: 1 %
HCT: 41.1 % (ref 39.0–52.0)
Hemoglobin: 12.4 g/dL — ABNORMAL LOW (ref 13.0–17.0)
Immature Granulocytes: 0 %
Lymphocytes Relative: 36 %
Lymphs Abs: 1.9 10*3/uL (ref 0.7–4.0)
MCH: 22.6 pg — ABNORMAL LOW (ref 26.0–34.0)
MCHC: 30.2 g/dL (ref 30.0–36.0)
MCV: 75 fL — ABNORMAL LOW (ref 80.0–100.0)
Monocytes Absolute: 0.4 10*3/uL (ref 0.1–1.0)
Monocytes Relative: 8 %
Neutro Abs: 2.9 10*3/uL (ref 1.7–7.7)
Neutrophils Relative %: 55 %
Platelets: 214 10*3/uL (ref 150–400)
RBC: 5.48 MIL/uL (ref 4.22–5.81)
RDW: 13.2 % (ref 11.5–15.5)
WBC: 5.4 10*3/uL (ref 4.0–10.5)
nRBC: 0 % (ref 0.0–0.2)

## 2020-04-06 LAB — IRON AND TIBC
Iron: 112 ug/dL (ref 45–182)
Saturation Ratios: 41 % — ABNORMAL HIGH (ref 17.9–39.5)
TIBC: 273 ug/dL (ref 250–450)
UIBC: 161 ug/dL

## 2020-04-06 LAB — FERRITIN: Ferritin: 270 ng/mL (ref 24–336)

## 2020-04-06 NOTE — Telephone Encounter (Signed)
Pt's mother, Mariann Laster, notiifed.

## 2020-04-06 NOTE — Progress Notes (Signed)
Patient denies new problems/concerns today.   °

## 2020-04-06 NOTE — Progress Notes (Signed)
Hematology/Oncology follow up note [facility] [phone] [phone]   Patient Care Team: Elba Barman, MD as PCP - General (Family Medicine)  REFERRING PROVIDER: Dr.Tahiliani Margretta Sidle REASON FOR VISIT Follow up for treatment of microcytic anemia  HISTORY OF PRESENTING ILLNESS:  Cameron Payne is a  44 y.o.  adult with PMH listed below who was referred to me for evaluation of microcytic anemia. Patient was diagnosed with ulcerative colitis after presenting with bloody stool in June 2019.  He has establish care with [facility] Dr. Bonna Gains.  Colonoscopy in June 2016 2019 showed suspicion examination for rectal ulcerative colitis.  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 Patient currently is on mesalamine 800 mg 3 times daily, mesalamine suppository 1000 mg at bedtime. Reports that no more additional episodes of bloody stool.  Denies any abdominal pain or black stool. 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 Iron panel was all obtained on 11/08/2017 which showed iron level 67, TIBC 339, ferritin 504.  Patient was referred to me for additional work-up for his microcytic anemia. Patient denies any nausea vomiting, abdominal pain, chest pain, shortness of breath, weight loss.   A# 01/10/2019 colonoscopy findings erythematous mucosa in the cecum, transverse colon, ascending colon, sigmoid colon and rectum.  Biopsies were obtained.  Pathology showed chronic inflammation.  Active colitis.  INTERVAL HISTORY Cameron Payne is a 44 y.o. adult who has above history reviewed by me today presents for follow up visit for management of microcytic anemia/affect thalassemia minor  Problems and complaints are listed below: Patient is accompanied by  mother. Reports no new complaints. Denies any blood in the stool constipation or diarrhea. He follows up with gastroenterology and was recently seen by Dr. Bonna Gains on 03/03/2020. Note was reviewed.  Patient's mesalamine was decreased to 3.6 g once daily. . Review of Systems  Constitutional: Negative for chills, fever, malaise/fatigue and weight loss.  HENT: Negative for nosebleeds and sore throat.   Eyes: Negative for double vision, photophobia and redness.  Respiratory: Negative for cough, shortness of breath and wheezing.   Cardiovascular: Negative for chest pain, palpitations, orthopnea and leg swelling.  Gastrointestinal: Negative for abdominal pain, blood in stool, nausea and vomiting.  Genitourinary: Negative for dysuria.  Musculoskeletal: Negative for back pain, myalgias and neck pain.  Skin: Negative for itching and rash.  Neurological: Negative for dizziness, tingling and tremors.  Endo/Heme/Allergies: Negative for environmental allergies. Does not bruise/bleed easily.  Psychiatric/Behavioral: Negative for depression and hallucinations.    MEDICAL HISTORY:  Past Medical History:  Diagnosis Date  . Developmental disability   . Hypertension   . Seizures ([place])    as a child    SURGICAL HISTORY: Past Surgical History:  Procedure Laterality Date  . COLONOSCOPY WITH PROPOFOL N/A 11/22/2017   Procedure: COLONOSCOPY WITH PROPOFOL;  Surgeon: Virgel Manifold, MD;  Location: [facility] ENDOSCOPY;  Service: Endoscopy;  Laterality: N/A;  . COLONOSCOPY WITH PROPOFOL N/A 01/10/2019   Procedure: COLONOSCOPY WITH PROPOFOL;  Surgeon: Virgel Manifold, MD;  Location: [facility] ENDOSCOPY;  Service: Endoscopy;  Laterality: N/A;  . ESOPHAGOGASTRODUODENOSCOPY (EGD) WITH PROPOFOL N/A 11/22/2017   Procedure: ESOPHAGOGASTRODUODENOSCOPY (EGD) WITH PROPOFOL;  Surgeon: Virgel Manifold, MD;  Location: [facility] ENDOSCOPY;  Service: Endoscopy;  Laterality: N/A;    SOCIAL HISTORY: Social History    Socioeconomic History  . Marital status: Single    Spouse  name: Not on file  . Number of children: Not on file  . Years of education: Not on file  . Highest education level: Not on file  Occupational History  . Not on file  Tobacco Use  . Smoking status: Former Smoker    Years: 25.00    Types: Cigars    Quit date: 06/11/2018    Years since quitting: 1.8  . Smokeless tobacco: Never Used  . Tobacco comment: occ.   Vaping Use  . Vaping Use: Never used  Substance and Sexual Activity  . Alcohol use: Yes    Alcohol/week: 1.0 standard drink    Types: 1 Cans of beer per week    Comment: occ.  . Drug use: Never  . Sexual activity: Not on file  Other Topics Concern  . Not on file  Social History Narrative  . Not on file   Social Determinants of Health   Financial Resource Strain:   . Difficulty of Paying Living Expenses: Not on file  Food Insecurity:   . Worried About Charity fundraiser in the Last Year: Not on file  . Ran Out of Food in the Last Year: Not on file  Transportation Needs:   . Lack of Transportation (Medical): Not on file  . Lack of Transportation (Non-Medical): Not on file  Physical Activity:   . Days of Exercise per Week: Not on file  . Minutes of Exercise per Session: Not on file  Stress:   . Feeling of Stress : Not on file  Social Connections:   . Frequency of Communication with Friends and Family: Not on file  . Frequency of Social Gatherings with Friends and Family: Not on file  . Attends Religious Services: Not on file  . Active Member of Clubs or Organizations: Not on file  . Attends Archivist Meetings: Not on file  . Marital Status: Not on file  Intimate Partner Violence:   . Fear of Current or Ex-Partner: Not on file  . Emotionally Abused: Not on file  . Physically Abused: Not on file  . Sexually Abused: Not on file    FAMILY HISTORY: Family History  Problem Relation Age of Onset  . Diabetes Mother   . Heart failure Mother    . Cancer Father        liver  . Cancer Paternal Grandfather        does not know what kind  . Cancer Maternal Aunt        breast (half sister) pt mother was adopted  . Colon cancer Neg Hx   . Thyroid disease Neg Hx     ALLERGIES:  has No Known Allergies.  MEDICATIONS:  Current Outpatient Medications  Medication Sig Dispense Refill  . Ascorbic Acid (VITAMIN C ADULT GUMMIES) 125 MG CHEW Chew by mouth.    . Calcium-Phosphorus-Vitamin D (CALCIUM/VITAMIN D3/ADULT GUMMY PO) Take by mouth.    . ELDERBERRY PO Take 1 capsule by mouth 1 day or 1 dose.    . fexofenadine (ALLEGRA) 180 MG tablet Take 180 mg by mouth daily.    . folic acid (FOLVITE) 233 MCG tablet Take 1 tablet (400 mcg total) by mouth daily. 90 tablet 1  . mesalamine (CANASA) 1000 MG suppository Place 1 suppository (1,000 mg total) rectally at bedtime. 30 suppository 2  . mesalamine (LIALDA) 1.2 g EC tablet Take 3 tablets (3.6 g total) by mouth daily with breakfast. 90 tablet 3   No current facility-administered medications for this  visit.     PHYSICAL EXAMINATION: ECOG PERFORMANCE STATUS: 0 - Asymptomatic Vitals:   04/06/20 1002  BP: 134/86  Pulse: 70  Resp: 16  Temp: (!) 95.2 F (35.1 C)   Filed Weights   04/06/20 1002  Weight: 157 lb 14.4 oz (71.6 kg)    Physical Exam Constitutional:      General: He is not in acute distress. HENT:     Head: Normocephalic and atraumatic.  Eyes:     General: No scleral icterus.    Pupils: Pupils are equal, round, and reactive to light.  Cardiovascular:     Rate and Rhythm: Normal rate and regular rhythm.     Heart sounds: Normal heart sounds.  Pulmonary:     Effort: Pulmonary effort is normal. No respiratory distress.     Breath sounds: No wheezing.  Abdominal:     General: Bowel sounds are normal. There is no distension.     Palpations: Abdomen is soft. There is no mass.     Tenderness: There is no abdominal tenderness.  Musculoskeletal:        General: No  deformity. Normal range of motion.     Cervical back: Normal range of motion and neck supple.  Skin:    General: Skin is warm and dry.     Findings: No erythema or rash.  Neurological:     Mental Status: He is alert and oriented to person, place, and time.     Cranial Nerves: No cranial nerve deficit.     Coordination: Coordination normal.  Psychiatric:        Behavior: Behavior normal.        Thought Content: Thought content normal.      LABORATORY DATA:  I have reviewed the data as listed Lab Results  Component Value Date   WBC 5.4 04/06/2020   HGB 12.4 (L) 04/06/2020   HCT 41.1 04/06/2020   MCV 75.0 (L) 04/06/2020   PLT 214 04/06/2020   Recent Labs    04/11/19 0943 04/11/19 0943 10/23/19 1602 03/03/20 1556 04/06/20 0929  NA 139  --  143 143 140  K 4.0   < > 4.5 4.4 3.8  CL 104   < > 105 104 107  CO2 29   < > 23 20 26   GLUCOSE 97  --  97 85 78  BUN 15  --  11 11 20   CREATININE 1.02   < > 1.00 1.01 0.81  CALCIUM 9.4   < > 9.2 9.7 9.1  GFRNONAA >60   < > 92 91 >60  GFRAA >60  --  107 106  --   PROT 8.0  --  7.0 7.3 7.5  ALBUMIN 4.7  --  4.5 4.7 4.3  AST 17   < > 16 15 17   ALT 12   < > 9 9 14   ALKPHOS 45   < > 54 55 40  BILITOT 0.4  --  0.3 <0.2 0.5   < > = values in this interval not displayed.   Iron/TIBC/Ferritin/ %Sat    Component Value Date/Time   IRON 112 04/06/2020 0929   IRON 67 11/08/2017 1113   TIBC 273 04/06/2020 0929   TIBC 239 (L) 11/08/2017 1113   FERRITIN 270 04/06/2020 0929   FERRITIN 504 (H) 11/08/2017 1113   IRONPCTSAT 41 (H) 04/06/2020 0929   IRONPCTSAT 28 11/08/2017 1113        ASSESSMENT & PLAN:  1. Microcytic anemia    Microcytic  anemia, secondary to alpha thalassemia minor. Labs reviewed and discussed with patient Iron panel showed iron saturation 41, ferritin 270.  No signs of iron deficiency.  No need for iron supplementation. Slightly increased iron saturation with stable ferritin level.  Continue monitor.  Ulcerative  colitis, currently on mesalamine.   Symptoms are controlled well.  Continue follow-up with gastroenterology.  Follow-up in 1 year  Orders Placed This Encounter  Procedures  . Retic Panel    Standing Status:   Future    Number of Occurrences:   1    Standing Expiration Date:   04/06/2021  . Return of visit: 1 year  Earlie Server, MD, PhD Hematology Oncology [facility] Pager- [phone] 04/06/2020

## 2020-04-06 NOTE — Telephone Encounter (Signed)
-----   Message from Earlie Server, MD sent at 04/06/2020  3:54 PM EST ----- Please let patient's mom know that patient's iron labs are stable, not decreased.  No need for iron supplementation.  Follow-up as planned.

## 2020-05-14 ENCOUNTER — Encounter: Payer: Self-pay | Admitting: Gastroenterology

## 2020-05-14 ENCOUNTER — Other Ambulatory Visit: Payer: Self-pay

## 2020-05-14 ENCOUNTER — Ambulatory Visit (INDEPENDENT_AMBULATORY_CARE_PROVIDER_SITE_OTHER): Payer: Medicare Other | Admitting: Gastroenterology

## 2020-05-14 VITALS — BP 135/87 | HR 78 | Temp 97.8°F | Ht 71.0 in | Wt 158.4 lb

## 2020-05-14 DIAGNOSIS — K5190 Ulcerative colitis, unspecified, without complications: Secondary | ICD-10-CM

## 2020-05-14 MED ORDER — SUPREP BOWEL PREP KIT 17.5-3.13-1.6 GM/177ML PO SOLN: 1.0000 | ORAL | 0 refills | Status: DC

## 2020-05-14 NOTE — Progress Notes (Signed)
[name], MD [address]  [place]  [place], [place] [address]  Main: [phone]  Fax: [phone]   Primary Care Physician: Elba Barman, MD   Chief Complaint  Patient presents with  . Ulcerative Colitis    HPI: Cameron Payne is a 44 y.o. adult here for follow-up of ulcerative colitis.  Patient compliant with Lialda 3.6 g a day and Canasa suppository.  Reports 1-2 formed bowel movement a day without blood.  No extraintestinal manifestations.  No fever or chills.  The patient denies abdominal or flank pain, anorexia, nausea or vomiting, dysphagia, change in bowel habits or black or bloody stools or weight loss.  Patient's fecal calprotectin was elevated at 483 on October 29, 2019.  This was repeated on June 30 and had shown improvement but borderline elevated at 109.  Previous history: last colonoscopy in August 2020 showed erythematous mucosa throughout the colon and was biopsied.  Biopsy showed mild to moderate chronic active colitis in the cecum.  And mild chronic active colitis of the rest of the colon.  Initially seen in clinic: November 01, 2017  Presenting symptom in clinic:Intermittent bright red blood per rectum. Mother noted weight loss at home. Pertinentinitiallab findings: Fecal calprotectin mildly elevated at 126  Index colonoscopy: November 22, 2017 Colonoscopy findings: Impression: - The examination was suspicious for rectal ulcerative  colitis ulcerative colitis. - The examined portion of the ileum was normal.  Biopsied. - Erythematous mucosa at the appendiceal orifice.  Biopsied. - Patchy mild mucosal changes were found in the rectum,  rule out ulcerative colitis. Biopsied. - The sigmoid colon, descending colon, transverse  colon,  ascending colon and cecum are normal. Biopsied. - The distal rectum and anal verge are normal on  retroflexion view.  EGD: November 22, 2017 Impression: - White nummular lesions in esophageal mucosa.  Brushings performed. - Normal stomach. - Normal duodenal bulb, second portion of the duodenum  and examined duodenum. Biopsied.   Surgical Pathology    DIAGNOSIS:  A. DUODENUM, SECOND PORTION END BULB; COLD BIOPSY:  - DUODENAL MUCOSA WITH PRESERVED VILLOUS ARCHITECTURE AND FOCAL  BRUNNER'S GLAND HYPERPLASIA.  - NEGATIVE FOR INTRA-EPITHELIAL LYMPHOCYTOSIS, DYSPLASIA AND MALIGNANCY.   B. TERMINAL ILEUM; COLD BIOPSY:  - SMALL BOWEL MUCOSA WITH INTACT VILLOUS ARCHITECTURE.  - NEGATIVE FOR INTRAEPITHELIAL LYMPHOCYTOSIS, DYSPLASIA AND MALIGNANCY.   C. COLON, APPENDICEAL ORIFICE; COLD BIOPSY:  - MILD ACTIVE COLITIS.  - NEGATIVE FOR DYSPLASIA AND MALIGNANCY.   D. COLON, CECUM AND ASCENDING; COLD BIOPSY:  - MUCOSAL EDEMA AND PROMINENT LYMPHOID AGGREGATES.  - NEGATIVE FOR DYSPLASIA AND MALIGNANCY.   E. TRANSVERSE COLON; COLD BIOPSY:  - MUCOSAL HEMORRHAGE AND PROMINENT LYMPHOID AGGREGATES.  - NEGATIVE FOR DYSPLASIA AND MALIGNANCY.   F. COLON, DESCENDING AND SIGMOID; COLD BIOPSY:  - MUCOSAL HEMORRHAGE.  - NEGATIVE FOR DYSPLASIA AND MALIGNANCY.   G. RECTUM; COLD BIOPSY:  - MODERATE ACTIVE COLITIS/PROCTITIS WITH ARCHITECTURAL FEATURES OF  CHRONICITY.  - NEGATIVE FOR DYSPLASIA AND MALIGNANCY.   Note: Sections of rectum show active colitis in form of cryptitis and  crypt abscess formation. Architectural features of chronicity are  present and include basal plasmacytosis, crypt dropout and crypt  irregularities. The differential diagnosis includes infectious colitis,  ischemia, drug (NSAIDs vs other), and idiopathic inflammatory bowel   disease (favored given clinical impression). Clinical correlation is  required.   ADDENDUM:  Sections of part C (appendiceal orifice) are re-reviewed at the  clinician's request. Mild crypt disarray and focal drop out  is seen,  suggestive of chronicity. Negative for infectious agents. Clinical  correlation is required regarding if the location sampled represents an  area of acute appendicitis. In conjunction with findings in the rectum,  the differential diagnosis includes infectious colitis, ischemia, drug  (NSAIDs vs other), biopsy adjacent to an inflamed diverticulum, and  early idiopathic inflammatory bowel disease. Clinical correlation is  required.       Current Outpatient Medications  Medication Sig Dispense Refill  . Ascorbic Acid (VITAMIN C ADULT GUMMIES) 125 MG CHEW Chew by mouth.    . ELDERBERRY PO Take 1 capsule by mouth 1 day or 1 dose.    . fexofenadine (ALLEGRA) 180 MG tablet Take 180 mg by mouth daily.    . folic acid (FOLVITE) 001 MCG tablet Take 1 tablet (400 mcg total) by mouth daily. 90 tablet 1  . mesalamine (CANASA) 1000 MG suppository Place 1 suppository (1,000 mg total) rectally at bedtime. 30 suppository 2  . mesalamine (LIALDA) 1.2 g EC tablet Take 3 tablets (3.6 g total) by mouth daily with breakfast. 90 tablet 3  . Calcium-Phosphorus-Vitamin D (CALCIUM/VITAMIN D3/ADULT GUMMY PO) Take by mouth. (Patient not taking: Reported on 05/14/2020)     No current facility-administered medications for this visit.    Allergies as of 05/14/2020  . (No Known Allergies)    ROS:  General: Negative for anorexia, weight loss, fever, chills, fatigue, weakness. ENT: Negative for hoarseness, difficulty swallowing , nasal congestion. CV: Negative for chest pain, angina, palpitations, dyspnea on exertion, peripheral edema.  Respiratory: Negative for dyspnea at rest, dyspnea on exertion, cough, sputum, wheezing.  GI: See history of present illness. GU:  Negative for  dysuria, hematuria, urinary incontinence, urinary frequency, nocturnal urination.  Endo: Negative for unusual weight change.    Physical Examination:   BP 135/87   Pulse 78   Temp 97.8 F (36.6 C) (Oral)   Ht 5' 11"  (1.803 m)   Wt 158 lb 6.4 oz (71.8 kg)   BMI 22.09 kg/m   General: Well-nourished, well-developed in no acute distress.  Eyes: No icterus. Conjunctivae pink. Mouth: Oropharyngeal mucosa moist and pink , no lesions erythema or exudate. Neck: Supple, Trachea midline Abdomen: Bowel sounds are normal, nontender, nondistended, no hepatosplenomegaly or masses, no abdominal bruits or hernia , no rebound or guarding.   Extremities: No lower extremity edema. No clubbing or deformities. Neuro: Alert and oriented x 3.  Grossly intact. Skin: Warm and dry, no jaundice.   Psych: Alert and cooperative, normal mood and affect.   Labs: CMP     Component Value Date/Time   NA 140 04/06/2020 0929   NA 143 03/03/2020 1556   NA 133 (L) 07/05/2013 0747   K 3.8 04/06/2020 0929   K 3.8 07/05/2013 0747   CL 107 04/06/2020 0929   CL 100 07/05/2013 0747   CO2 26 04/06/2020 0929   CO2 32 07/05/2013 0747   GLUCOSE 78 04/06/2020 0929   GLUCOSE 94 07/05/2013 0747   BUN 20 04/06/2020 0929   BUN 11 03/03/2020 1556   BUN 10 07/05/2013 0747   CREATININE 0.81 04/06/2020 0929   CREATININE 1.05 07/05/2013 0747   CALCIUM 9.1 04/06/2020 0929   CALCIUM 9.4 07/05/2013 0747   PROT 7.5 04/06/2020 0929   PROT 7.3 03/03/2020 1556   ALBUMIN 4.3 04/06/2020 0929   ALBUMIN 4.7 03/03/2020 1556   AST 17 04/06/2020 0929   ALT 14 04/06/2020 0929   ALKPHOS 40 04/06/2020 0929   BILITOT 0.5 04/06/2020  0929   BILITOT <0.2 03/03/2020 1556   GFRNONAA >60 04/06/2020 0929   GFRNONAA >60 07/05/2013 0747   GFRAA 106 03/03/2020 1556   GFRAA >60 07/05/2013 0747   Lab Results  Component Value Date   WBC 5.4 04/06/2020   HGB 12.4 (L) 04/06/2020   HCT 41.1 04/06/2020   MCV 75.0 (L) 04/06/2020   PLT 214  04/06/2020    Imaging Studies: No results found.  Assessment and Plan:   Cameron Payne is a 44 y.o. y/o adult here for follow-up of ulcerative colitis  Patient in clinical remission Repeat upper endoscopy at this time to assess for endoscopic and histologic remission  Continue above medication regimen until then  No extraintestinal manifestations present  Patient and mother informed of any alarm symptoms to discuss with [place] if they occur in the future    Dr [name]

## 2020-06-11 ENCOUNTER — Telehealth: Payer: Self-pay | Admitting: Gastroenterology

## 2020-06-11 NOTE — Telephone Encounter (Signed)
Notification or Prior Authorization is not required for the requested services  Decision ID #:[id number]  The number above acknowledges your inquiry and our response. Please write this number down and refer to it for future inquiries. Coverage and payment for an item or service is governed by the member's benefit plan document, and, if applicable, the provider's participation agreement with the Health Plan. Expand all   Collapse all PATIENT DETAILS PATIENT NAME RELATIONSHIP VERBAL LANGUAGE PREFERENCE MESSAGE Cameron Payne Employee English A future timeline may be available for this member. For future coverage please call the telephone number located on the back of the [facility] ID card. MEMBER NUMBER EFFECTIVE DATE WRITTEN LANGUAGE PREFERENCE [id number] 05/30/2020 English GROUP NUMBER TERMINATION DATE [id number] 05/29/2021 PRODUCT INSURANCE TYPE HMO Medicare ADMITTING/ATTENDING PHYSICIAN DETAILS  NAME ADDRESS Cameron Payne [place], [place], [place]  [address] TAX ID STATUS [id number] In-Network [facility]S [facility] [place] [place], [place]  [address] FACILITY ID NUMBER* STATUS [id number] In-Network SERVICE DETAILS PLACE OF SERVICE  SERVICE DETAILS  Outpatient Facility Surgical  FACILITY SERVICE DATES DETAILS START DATE*: END DATE: SERVICE DESCRIPTION*  06/24/2020 09/22/2020 Scheduled DIAGNOSIS DETAILS Please list the primary diagnosis code along with any other secondary codes. You may enter up to 10 diagnosis codes. If you do not list the primary diagnosis code specific to the service you are requesting, then you may receive a message in error that prior authorization is not required.  CODE DESCRIPTION  Primary K51.80 OTH ULCERATIVE COLITIS W/O COMP   PROCEDURE DETAILS  Please list the primary procedure code along with any other secondary codes. Primary procedure code is required. You many enter up  to 14 procedure codes.  CODE DESCRIPTION SERVICING PROVIDER NAME, TAX ID, STATUS, ADDRESS    Primary [phone] Colonoscopy, flexible; diagnostic, inclu more   Cameron Payne,  [id number] [place],  [place],  [place], [place]  [address] REVIEW PRIORITY Expedited Review  By checking this box and indicating that you are requesting an Expedited Review, you acknowledge that you have read and are adhering to the regulations pertaining to requesting an Expedited Review. Medicare: 42 CFR Section 422.570 Medicaid: CFR Section 438.210 All other membership: [place] and [place] 29 CFR [phone] AND 29 cfr 2560.503 INITIAL CONTACT DETAILS (Person submitting the notification/prior authorization) NAME* PHONE NUMBER + EXT.Joylene Igo NUMBER Kae Heller [phone] FOLLOW-UP CONTACT DETAILS It is important that you provide the contact information of the individual who can provide additional clinical information and assist with discharge planning activities, if applicable.  NAME* ROLE* DEPARTMENT [facility] - PROVIDER PHONE NUMBER + EXT.Joylene Igo NUMBER*  EMAIL [phone] [place] NUMBER  MEMBER PHONE NUMBER + EXT. CLINICAL NOTES

## 2020-06-17 ENCOUNTER — Telehealth: Payer: Self-pay | Admitting: Gastroenterology

## 2020-06-17 ENCOUNTER — Telehealth: Payer: Self-pay

## 2020-06-17 NOTE — Telephone Encounter (Signed)
Patient"s Mom called to cancel procedure for 06/24/20.  Would like to reschedule. Patient has Covid.  Please call

## 2020-06-17 NOTE — Telephone Encounter (Signed)
Returned patients mothers call Milus Glazier) in regard to rescheduling her son's colonoscopy.  She stated in the phone message that he has COVID.  I've left voice message with Mrs. Ermalinda Barrios to call the office back to reschedule colonoscopy.  He is currently scheduled for 06/24/20. Will await callback to reschedule.  Thanks,  [place], [place]

## 2020-06-18 ENCOUNTER — Other Ambulatory Visit: Payer: Self-pay

## 2020-06-18 DIAGNOSIS — K5190 Ulcerative colitis, unspecified, without complications: Secondary | ICD-10-CM

## 2020-06-18 NOTE — Telephone Encounter (Signed)
Called patient's mother but she did not answer, I left her a voicemail to return my call to reschedule her son's procedure.

## 2020-06-18 NOTE — Addendum Note (Signed)
Addended by: Wayna Chalet on: 06/18/2020 08:55 AM   Modules accepted: Orders

## 2020-06-18 NOTE — Telephone Encounter (Signed)
Patient's mother called and we were able to reschedule his colonoscopy to 07/09/2020. Information will be mailed with new information.

## 2020-06-22 ENCOUNTER — Other Ambulatory Visit: Admission: RE | Admit: 2020-06-22 | Payer: Medicare Other | Source: Ambulatory Visit

## 2020-07-07 ENCOUNTER — Other Ambulatory Visit
Admission: RE | Admit: 2020-07-07 | Discharge: 2020-07-07 | Disposition: A | Discharge status: Home / Self Care | Payer: Medicare Other | Source: Ambulatory Visit | Attending: Gastroenterology | Admitting: Gastroenterology

## 2020-07-07 ENCOUNTER — Other Ambulatory Visit: Payer: Self-pay

## 2020-07-07 DIAGNOSIS — Z20822 Contact with and (suspected) exposure to covid-19: Secondary | ICD-10-CM | POA: Diagnosis not present

## 2020-07-07 DIAGNOSIS — Z01812 Encounter for preprocedural laboratory examination: Secondary | ICD-10-CM | POA: Diagnosis present

## 2020-07-07 NOTE — Pre-Procedure Instructions (Signed)
Patient states he had tested positive at [facility] on 06/06/20.  We have no documentation of this, so patient was tested today for his upcoming procedure.

## 2020-07-08 ENCOUNTER — Encounter: Payer: Self-pay | Admitting: Gastroenterology

## 2020-07-08 LAB — SARS CORONAVIRUS 2 (TAT 6-24 HRS): SARS Coronavirus 2: NEGATIVE

## 2020-07-09 ENCOUNTER — Encounter
Admission: RE | Disposition: A | Discharge status: Home / Self Care | Payer: Self-pay | Source: Home / Self Care | Attending: Gastroenterology

## 2020-07-09 ENCOUNTER — Ambulatory Visit: Payer: Medicare Other | Admitting: Certified Registered"

## 2020-07-09 ENCOUNTER — Encounter: Payer: Self-pay | Admitting: Gastroenterology

## 2020-07-09 ENCOUNTER — Ambulatory Visit: Admission: RE | Admit: 2020-07-09 | Payer: Medicare Other | Source: Home / Self Care | Admitting: Gastroenterology

## 2020-07-09 ENCOUNTER — Ambulatory Visit
Admission: RE | Admit: 2020-07-09 | Discharge: 2020-07-09 | Disposition: A | Discharge status: Home / Self Care | Payer: Medicare Other | Attending: Gastroenterology | Admitting: Gastroenterology

## 2020-07-09 ENCOUNTER — Other Ambulatory Visit: Payer: Self-pay

## 2020-07-09 ENCOUNTER — Encounter: Admission: RE | Payer: Self-pay | Source: Home / Self Care

## 2020-07-09 DIAGNOSIS — K635 Polyp of colon: Secondary | ICD-10-CM | POA: Diagnosis not present

## 2020-07-09 DIAGNOSIS — Z87891 Personal history of nicotine dependence: Secondary | ICD-10-CM | POA: Diagnosis not present

## 2020-07-09 DIAGNOSIS — D124 Benign neoplasm of descending colon: Secondary | ICD-10-CM | POA: Diagnosis not present

## 2020-07-09 DIAGNOSIS — K5190 Ulcerative colitis, unspecified, without complications: Secondary | ICD-10-CM | POA: Diagnosis not present

## 2020-07-09 DIAGNOSIS — Z8719 Personal history of other diseases of the digestive system: Secondary | ICD-10-CM | POA: Diagnosis present

## 2020-07-09 HISTORY — PX: COLONOSCOPY WITH PROPOFOL: SHX5780

## 2020-07-09 SURGERY — COLONOSCOPY WITH PROPOFOL
Anesthesia: General

## 2020-07-09 MED ORDER — PROPOFOL 500 MG/50ML IV EMUL
INTRAVENOUS | Status: AC
  Filled 2020-07-09: qty 100

## 2020-07-09 MED ORDER — GLYCOPYRROLATE 0.2 MG/ML IJ SOLN
INTRAMUSCULAR | Status: AC
  Filled 2020-07-09: qty 1

## 2020-07-09 MED ORDER — LIDOCAINE HCL (PF) 2 % IJ SOLN
INTRAMUSCULAR | Status: AC
  Filled 2020-07-09: qty 5

## 2020-07-09 MED ORDER — MIDAZOLAM HCL 5 MG/5ML IJ SOLN
INTRAMUSCULAR | Status: DC | PRN
  Administered 2020-07-09: 2 mg via INTRAVENOUS

## 2020-07-09 MED ORDER — LIDOCAINE 2% (20 MG/ML) 5 ML SYRINGE
INTRAMUSCULAR | Status: DC | PRN
  Administered 2020-07-09: 25 mg via INTRAVENOUS

## 2020-07-09 MED ORDER — PROPOFOL 500 MG/50ML IV EMUL
INTRAVENOUS | Status: DC | PRN
  Administered 2020-07-09: 120 ug/kg/min via INTRAVENOUS

## 2020-07-09 MED ORDER — MIDAZOLAM HCL 2 MG/2ML IJ SOLN
INTRAMUSCULAR | Status: AC
  Filled 2020-07-09: qty 2

## 2020-07-09 MED ORDER — SODIUM CHLORIDE 0.9 % IV SOLN: INTRAVENOUS | Status: DC

## 2020-07-09 MED ORDER — PROPOFOL 10 MG/ML IV BOLUS
INTRAVENOUS | Status: DC | PRN
  Administered 2020-07-09: 100 mg via INTRAVENOUS

## 2020-07-09 NOTE — Op Note (Addendum)
[facility] Patient Name: Cameron Payne Procedure Date: 07/09/2020 7:53 AM MRN: [id number] Account #: [id number] Date of Birth: 1976-05-19 Admit Type: Outpatient Age: 45 Room: [facility] ENDO ROOM 2 Gender: Male Note Status: Finalized Procedure:             Colonoscopy Indications:           Personal history of ulcerative colitis Providers:             Yossi Hinchman B. Bonna Gains MD, MD Referring MD:          Marrianne Mood, MD (Referring MD) Medicines:             Monitored Anesthesia Care Complications:         No immediate complications. Procedure:             Pre-Anesthesia Assessment:                        - ASA Grade Assessment: II - A patient with mild                         systemic disease.                        - Prior to the procedure, a History and Physical was                         performed, and patient medications, allergies and                         sensitivities were reviewed. The patient's tolerance                         of previous anesthesia was reviewed.                        - The risks and benefits of the procedure and the                         sedation options and risks were discussed with the                         patient. All questions were answered and informed                         consent was obtained.                        - Patient identification and proposed procedure were                         verified prior to the procedure by the physician, the                         nurse, the anesthesiologist, the anesthetist and the                         technician. The procedure was verified in the                         procedure room.  After obtaining informed consent, the colonoscope was                         passed under direct vision. Throughout the procedure,                         the patient's blood pressure, pulse, and oxygen                         saturations were monitored  continuously. The                         Colonoscope was introduced through the anus and                         advanced to the the terminal ileum. The colonoscopy                         was performed with ease. The patient tolerated the                         procedure well. The quality of the bowel preparation                         was good. Findings:      The perianal and digital rectal examinations were normal.      A 4 mm polyp was found in the descending colon. The polyp was sessile.       The polyp was removed with a cold biopsy forceps. Resection and       retrieval were complete.      The exam was otherwise without abnormality.      The rectum, sigmoid colon, descending colon, transverse colon, ascending       colon, cecum and ileum appeared normal. Biopsies were taken with a cold       forceps for histology.      The retroflexed view of the distal rectum and anal verge was normal and       showed no anal or rectal abnormalities. Impression:            - One 4 mm polyp in the descending colon, removed with                         a cold biopsy forceps. Resected and retrieved.                        - The examination was otherwise normal.                        - The rectum, sigmoid colon, descending colon,                         transverse colon, ascending colon, cecum and terminal                         ileum are normal. Biopsied.                        - The distal rectum and anal verge are normal on  retroflexion view. Recommendation:        - Await pathology results.                        - Return to my office as previously scheduled.                        - Discharge patient to home (with escort).                        - Advance diet as tolerated.                        - Continue present medications.                        - The findings and recommendations were discussed with                         the patient.                         - The findings and recommendations were discussed with                         the patient's family.                        - Return to primary care physician as previously                         scheduled. Procedure Code(s):     --- Professional ---                        [phone], Colonoscopy, flexible; with biopsy, single or                         multiple Diagnosis Code(s):     --- Professional ---                        K63.5, Polyp of colon                        Z87.19, Personal history of other diseases of the                         digestive system CPT copyright 2019 American Medical Association. All rights reserved. The codes documented in this report are preliminary and upon coder review may  be revised to meet current compliance requirements.  [name], MD Margretta Sidle B. Bonna Gains MD, MD 07/09/2020 8:45:41 AM This report has been signed electronically. Number of Addenda: 0 Note Initiated On: 07/09/2020 7:53 AM Scope Withdrawal Time: 0 hours 19 minutes 23 seconds  Total Procedure Duration: 0 hours 29 minutes 12 seconds  Estimated Blood Loss:  Estimated blood loss: none.      [facility]

## 2020-07-09 NOTE — Transfer of Care (Signed)
Immediate Anesthesia Transfer of Care Note  Patient: Cameron Payne  Procedure(s) Performed: COLONOSCOPY WITH PROPOFOL (N/A )  Patient Location: Endoscopy Unit  Anesthesia Type:General  Level of Consciousness: drowsy  Airway & Oxygen Therapy: Patient Spontanous Breathing  Post-op Assessment: Report given to RN and Post -op Vital signs reviewed and stable  Post vital signs: Reviewed  Last Vitals:  Vitals Value Taken Time  BP 105/63 07/09/20 0845  Temp 36.3 C 07/09/20 0844  Pulse 70 07/09/20 0847  Resp 20 07/09/20 0847  SpO2 100 % 07/09/20 0847  Vitals shown include unvalidated device data.  Last Pain:  Vitals:   07/09/20 0844  TempSrc: Temporal  PainSc: Asleep         Complications: No complications documented.

## 2020-07-09 NOTE — H&P (Signed)
[name], MD [address] [place], [place], [place], [address] [address], [place], [place], [place], [address] Phone: [phone]  Fax: [phone]  Primary Care Physician:  Elba Barman, MD   Pre-Procedure History & Physical: HPI:  Cameron Payne is a 45 y.o. adult is here for a colonoscopy.   Past Medical History:  Diagnosis Date  . Developmental disability   . Hypertension   . Seizures ([place])    as a child    Past Surgical History:  Procedure Laterality Date  . COLONOSCOPY WITH PROPOFOL N/A 11/22/2017   Procedure: COLONOSCOPY WITH PROPOFOL;  Surgeon: Virgel Manifold, MD;  Location: [facility] ENDOSCOPY;  Service: Endoscopy;  Laterality: N/A;  . COLONOSCOPY WITH PROPOFOL N/A 01/10/2019   Procedure: COLONOSCOPY WITH PROPOFOL;  Surgeon: Virgel Manifold, MD;  Location: [facility] ENDOSCOPY;  Service: Endoscopy;  Laterality: N/A;  . ESOPHAGOGASTRODUODENOSCOPY (EGD) WITH PROPOFOL N/A 11/22/2017   Procedure: ESOPHAGOGASTRODUODENOSCOPY (EGD) WITH PROPOFOL;  Surgeon: Virgel Manifold, MD;  Location: [facility] ENDOSCOPY;  Service: Endoscopy;  Laterality: N/A;    Prior to Admission medications   Medication Sig Start Date End Date Taking? Authorizing Provider  Ascorbic Acid (VITAMIN C ADULT GUMMIES) 125 MG CHEW Chew by mouth.   Yes [provider]  fexofenadine (ALLEGRA) 180 MG tablet Take 180 mg by mouth daily.   Yes [provider]  folic acid (FOLVITE) 702 MCG tablet Take 1 tablet (400 mcg total) by mouth daily. 03/12/18  Yes Earlie Server, MD  mesalamine (LIALDA) 1.2 g EC tablet Take 3 tablets (3.6 g total) by mouth daily with breakfast. 03/12/20  Yes Virgel Manifold, MD  Calcium-Phosphorus-Vitamin D (CALCIUM/VITAMIN D3/ADULT GUMMY PO) Take by mouth. Patient not taking: Reported on 07/09/2020    [provider]  ELDERBERRY PO Take 1 capsule by mouth 1 day or 1 dose.    [provider]  mesalamine (CANASA) 1000 MG suppository  Place 1 suppository (1,000 mg total) rectally at bedtime. 03/03/20   Virgel Manifold, MD  Na Sulfate-K Sulfate-Mg Sulf (SUPREP BOWEL PREP KIT) 17.5-3.13-1.6 GM/177ML SOLN Take 1 kit by mouth as directed. 05/14/20   Virgel Manifold, MD    Allergies as of 06/18/2020  . (No Known Allergies)    Family History  Problem Relation Age of Onset  . Diabetes Mother   . Heart failure Mother   . Cancer Father        liver  . Cancer Paternal Grandfather        does not know what kind  . Cancer Maternal Aunt        breast (half sister) pt mother was adopted  . Colon cancer Neg Hx   . Thyroid disease Neg Hx     Social History   Socioeconomic History  . Marital status: Single    Spouse name: Not on file  . Number of children: Not on file  . Years of education: Not on file  . Highest education level: Not on file  Occupational History  . Not on file  Tobacco Use  . Smoking status: Former Smoker    Years: 25.00    Types: Cigars    Quit date: 06/11/2018    Years since quitting: 2.0  . Smokeless tobacco: Never Used  . Tobacco comment: occ.   Vaping Use  . Vaping Use: Never used  Substance and Sexual Activity  . Alcohol use: Yes    Alcohol/week: 2.0 standard drinks    Types: 2 Cans of  beer per week    Comment: occ.  . Drug use: Never  . Sexual activity: Not on file  Other Topics Concern  . Not on file  Social History Narrative  . Not on file   Social Determinants of Health   Financial Resource Strain: Not on file  Food Insecurity: Not on file  Transportation Needs: Not on file  Physical Activity: Not on file  Stress: Not on file  Social Connections: Not on file  Intimate Partner Violence: Not on file    Review of Systems: See HPI, otherwise negative ROS  Physical Exam: BP (!) 143/97   Pulse 71   Temp (!) 97.1 F (36.2 C) (Temporal)   Resp 16   Ht 5' 11"  (1.803 m)   Wt 71.8 kg   SpO2 100%   BMI 22.08 kg/m  General:   Alert,  pleasant and cooperative in  NAD Head:  Normocephalic and atraumatic. Neck:  Supple; no masses or thyromegaly. Lungs:  Clear throughout to auscultation, normal respiratory effort.    Heart:  +S1, +S2, Regular rate and rhythm, No edema. Abdomen:  Soft, nontender and nondistended. Normal bowel sounds, without guarding, and without rebound.   Neurologic:  Alert and  oriented x4;  grossly normal neurologically.  Impression/Plan: Cameron Payne is here for a colonoscopy to be performed for ulcerative colitis  Risks, benefits, limitations, and alternatives regarding  colonoscopy have been reviewed with the patient.  Questions have been answered.  All parties agreeable.   Virgel Manifold, MD  07/09/2020, 8:06 AM

## 2020-07-09 NOTE — Anesthesia Preprocedure Evaluation (Signed)
Anesthesia Evaluation  Patient identified by MRN, date of birth, ID band Patient awake    Reviewed: Allergy & Precautions, H&P , NPO status , Patient's Chart, lab work & pertinent test results, reviewed documented beta blocker date and time   Airway Mallampati: II   Neck ROM: full    Dental  (+) Teeth Intact   Pulmonary neg pulmonary ROS, former smoker,    Pulmonary exam normal        Cardiovascular Exercise Tolerance: Good hypertension, On Medications negative cardio ROS Normal cardiovascular exam Rhythm:regular Rate:Normal     Neuro/Psych Seizures -,  negative psych ROS   GI/Hepatic Neg liver ROS, PUD,   Endo/Other  negative endocrine ROS  Renal/GU negative Renal ROS  negative genitourinary   Musculoskeletal   Abdominal   Peds  Hematology  (+) Blood dyscrasia, anemia ,   Anesthesia Other Findings Past Medical History: No date: Developmental disability No date: Hypertension No date: Seizures ([place])     Comment:  as a child Past Surgical History: 11/22/2017: COLONOSCOPY WITH PROPOFOL; N/A     Comment:  Procedure: COLONOSCOPY WITH PROPOFOL;  Surgeon:               Virgel Manifold, MD;  Location: [facility] ENDOSCOPY;                Service: Endoscopy;  Laterality: N/A; 01/10/2019: COLONOSCOPY WITH PROPOFOL; N/A     Comment:  Procedure: COLONOSCOPY WITH PROPOFOL;  Surgeon:               Virgel Manifold, MD;  Location: [facility] ENDOSCOPY;                Service: Endoscopy;  Laterality: N/A; 11/22/2017: ESOPHAGOGASTRODUODENOSCOPY (EGD) WITH PROPOFOL; N/A     Comment:  Procedure: ESOPHAGOGASTRODUODENOSCOPY (EGD) WITH               PROPOFOL;  Surgeon: Virgel Manifold, MD;  Location:               [facility] ENDOSCOPY;  Service: Endoscopy;  Laterality: N/A; BMI    Body Mass Index: 22.08 kg/m     Reproductive/Obstetrics negative OB ROS                             Anesthesia  Physical Anesthesia Plan  ASA: II  Anesthesia Plan: General   Post-op Pain Management:    Induction:   PONV Risk Score and Plan: 2  Airway Management Planned:   Additional Equipment:   Intra-op Plan:   Post-operative Plan:   Informed Consent: I have reviewed the patients History and Physical, chart, labs and discussed the procedure including the risks, benefits and alternatives for the proposed anesthesia with the patient or authorized representative who has indicated his/her understanding and acceptance.     Dental Advisory Given  Plan Discussed with: CRNA  Anesthesia Plan Comments:         Anesthesia Quick Evaluation

## 2020-07-10 ENCOUNTER — Encounter: Payer: Self-pay | Admitting: Gastroenterology

## 2020-07-10 LAB — SURGICAL PATHOLOGY

## 2020-07-10 NOTE — Anesthesia Postprocedure Evaluation (Signed)
Anesthesia Post Note  Patient: Cameron Payne  Procedure(s) Performed: COLONOSCOPY WITH PROPOFOL (N/A )  Patient location during evaluation: PACU Anesthesia Type: General Level of consciousness: awake and alert Pain management: pain level controlled Vital Signs Assessment: post-procedure vital signs reviewed and stable Respiratory status: spontaneous breathing, nonlabored ventilation, respiratory function stable and patient connected to nasal cannula oxygen Cardiovascular status: blood pressure returned to baseline and stable Postop Assessment: no apparent nausea or vomiting Anesthetic complications: no   No complications documented.   Last Vitals:  Vitals:   07/09/20 0844 07/09/20 0914  BP: 105/63 136/89  Pulse: 76   Resp: (!) 21   Temp: (!) 36.3 C   SpO2: 100%     Last Pain:  Vitals:   07/09/20 0914  TempSrc:   PainSc: 0-No pain                 Molli Barrows

## 2020-07-15 ENCOUNTER — Ambulatory Visit (INDEPENDENT_AMBULATORY_CARE_PROVIDER_SITE_OTHER): Payer: Medicare Other | Admitting: Gastroenterology

## 2020-07-15 ENCOUNTER — Encounter: Payer: Self-pay | Admitting: Gastroenterology

## 2020-07-15 ENCOUNTER — Other Ambulatory Visit: Payer: Self-pay

## 2020-07-15 VITALS — BP 170/79 | HR 73 | Temp 98.4°F | Ht 71.0 in | Wt 157.0 lb

## 2020-07-15 DIAGNOSIS — K5190 Ulcerative colitis, unspecified, without complications: Secondary | ICD-10-CM | POA: Diagnosis not present

## 2020-07-15 MED ORDER — MESALAMINE 1000 MG RE SUPP: 1000.0000 mg | Freq: Every day | RECTAL | 3 refills | Status: DC

## 2020-07-15 MED ORDER — MESALAMINE 1.2 G PO TBEC: 3.6000 g | DELAYED_RELEASE_TABLET | Freq: Every day | ORAL | 3 refills | Status: DC

## 2020-07-15 NOTE — Progress Notes (Signed)
[name], MD [address]  [place]  [place], [place] [address]  Main: [phone]  Fax: [phone]   Primary Care Physician: Elba Barman, MD   Chief Complaint  Patient presents with  . Ulcerative Colitis    HPI: Cameron Payne is a 45 y.o. adult here for follow-up of ulcerative colitis.  Patient compliant with Lialda 3.6 grams a day, and Canasa suppository.  Reports 2 formed bowel movements a day without blood.  Good appetite.  No nausea or vomiting.  No weight loss.  No fever chills.  Denies any extraintestinal manifestations  Patient's most recent colonoscopy shows complete endoscopic, and histologic remission  Current Outpatient Medications  Medication Sig Dispense Refill  . Ascorbic Acid (VITAMIN C ADULT GUMMIES) 125 MG CHEW Chew by mouth.    . Calcium-Phosphorus-Vitamin D (CALCIUM/VITAMIN D3/ADULT GUMMY PO) Take by mouth.    . ELDERBERRY PO Take 1 capsule by mouth 1 day or 1 dose.    . fexofenadine (ALLEGRA) 180 MG tablet Take 180 mg by mouth daily.    . folic acid (FOLVITE) 326 MCG tablet Take 1 tablet (400 mcg total) by mouth daily. 90 tablet 1  . mesalamine (CANASA) 1000 MG suppository Place 1 suppository (1,000 mg total) rectally at bedtime. 30 suppository 2  . mesalamine (LIALDA) 1.2 g EC tablet Take 3 tablets (3.6 g total) by mouth daily with breakfast. 90 tablet 3   No current facility-administered medications for this visit.    Allergies as of 07/15/2020  . (No Known Allergies)    ROS:  General: Negative for anorexia, weight loss, fever, chills, fatigue, weakness. ENT: Negative for hoarseness, difficulty swallowing , nasal congestion. CV: Negative for chest pain, angina, palpitations, dyspnea on exertion, peripheral edema.  Respiratory: Negative for dyspnea at rest, dyspnea on exertion, cough, sputum, wheezing.  GI: See history of present illness. GU:  Negative for dysuria, hematuria, urinary incontinence, urinary frequency,  nocturnal urination.  Endo: Negative for unusual weight change.    Physical Examination:   BP (!) 170/79   Pulse 73   Temp 98.4 F (36.9 C) (Oral)   Ht 5' 11"  (1.803 m)   Wt 157 lb (71.2 kg)   BMI 21.90 kg/m   General: Well-nourished, well-developed in no acute distress.  Eyes: No icterus. Conjunctivae pink. Mouth: Oropharyngeal mucosa moist and pink , no lesions erythema or exudate. Neck: Supple, Trachea midline Abdomen: Bowel sounds are normal, nontender, nondistended, no hepatosplenomegaly or masses, no abdominal bruits or hernia , no rebound or guarding.   Extremities: No lower extremity edema. No clubbing or deformities. Neuro: Alert and oriented x 3.  Grossly intact. Skin: Warm and dry, no jaundice.   Psych: Alert and cooperative, normal mood and affect.   Labs: CMP     Component Value Date/Time   NA 140 04/06/2020 0929   NA 143 03/03/2020 1556   NA 133 (L) 07/05/2013 0747   K 3.8 04/06/2020 0929   K 3.8 07/05/2013 0747   CL 107 04/06/2020 0929   CL 100 07/05/2013 0747   CO2 26 04/06/2020 0929   CO2 32 07/05/2013 0747   GLUCOSE 78 04/06/2020 0929   GLUCOSE 94 07/05/2013 0747   BUN 20 04/06/2020 0929   BUN 11 03/03/2020 1556   BUN 10 07/05/2013 0747   CREATININE 0.81 04/06/2020 0929   CREATININE 1.05 07/05/2013 0747   CALCIUM 9.1 04/06/2020 0929   CALCIUM 9.4 07/05/2013 0747   PROT 7.5 04/06/2020 0929   PROT  7.3 03/03/2020 1556   ALBUMIN 4.3 04/06/2020 0929   ALBUMIN 4.7 03/03/2020 1556   AST 17 04/06/2020 0929   ALT 14 04/06/2020 0929   ALKPHOS 40 04/06/2020 0929   BILITOT 0.5 04/06/2020 0929   BILITOT <0.2 03/03/2020 1556   GFRNONAA >60 04/06/2020 0929   GFRNONAA >60 07/05/2013 0747   GFRAA 106 03/03/2020 1556   GFRAA >60 07/05/2013 0747   Lab Results  Component Value Date   WBC 5.4 04/06/2020   HGB 12.4 (L) 04/06/2020   HCT 41.1 04/06/2020   MCV 75.0 (L) 04/06/2020   PLT 214 04/06/2020    Imaging Studies: No results found.  Assessment  and Plan:   Cameron Payne is a 45 y.o. y/o adult here for follow-up of ulcerative colitis  Patient currently in clinical and histologic remission with Lialda and Canasa  Continue current dose at this time  He was on a lower dose before but due to continued symptoms, he was increased to 3.6 g a day  Since he is now in clinical remission, will continue 3.6 g a day at this time along with his suppository  Follow-up in 2 to 3 months or earlier if necessary if new symptoms occur  Dr [name]

## 2020-07-15 NOTE — Addendum Note (Signed)
Addended by: Wayna Chalet on: 07/15/2020 02:03 PM   Modules accepted: Orders

## 2020-08-12 ENCOUNTER — Ambulatory Visit: Payer: Medicare Other | Admitting: Gastroenterology

## 2020-10-14 ENCOUNTER — Encounter: Payer: Self-pay | Admitting: Gastroenterology

## 2020-10-14 ENCOUNTER — Other Ambulatory Visit: Payer: Self-pay

## 2020-10-14 ENCOUNTER — Ambulatory Visit (INDEPENDENT_AMBULATORY_CARE_PROVIDER_SITE_OTHER): Payer: Medicare Other | Admitting: Gastroenterology

## 2020-10-14 VITALS — BP 125/72 | HR 76 | Temp 98.1°F | Ht 71.0 in | Wt 155.4 lb

## 2020-10-14 DIAGNOSIS — K5190 Ulcerative colitis, unspecified, without complications: Secondary | ICD-10-CM

## 2020-10-14 MED ORDER — MESALAMINE 1000 MG RE SUPP: 1000.0000 mg | Freq: Every day | RECTAL | 2 refills | Status: DC

## 2020-10-14 MED ORDER — MESALAMINE 1.2 G PO TBEC: 3.6000 g | DELAYED_RELEASE_TABLET | Freq: Every day | ORAL | 1 refills | Status: DC

## 2020-10-14 NOTE — Progress Notes (Signed)
Cameron Antigua, MD [address]  [place]  [place], [place] [address]  Main: [phone]  Fax: [phone]   Primary Care Physician: Elba Barman, MD   Chief Complaint  Patient presents with  . Ulcerative Colitis    HPI: Cameron Payne is a 45 y.o. adult here for follow-up of ulcerative colitis.  Patient reports 1-2 formed bowel moods a day without blood.  No abdominal pain, nausea or vomiting.  No extra intestinal manifestations.  No fever or chills.  Good appetite.  No weight loss.  Compliant with oral and rectal mesalamine.  Patient is in endoscopic and histologic remission based on most recent colonoscopy in February 2022  Current Outpatient Medications  Medication Sig Dispense Refill  . Ascorbic Acid (VITAMIN C ADULT GUMMIES) 125 MG CHEW Chew by mouth.    . Calcium-Phosphorus-Vitamin D (CALCIUM/VITAMIN D3/ADULT GUMMY PO) Take by mouth.    . ELDERBERRY PO Take 1 capsule by mouth 1 day or 1 dose.    . fexofenadine (ALLEGRA) 180 MG tablet Take 180 mg by mouth daily.    . folic acid (FOLVITE) 825 MCG tablet Take 1 tablet (400 mcg total) by mouth daily. 90 tablet 1  . mesalamine (CANASA) 1000 MG suppository Place 1 suppository (1,000 mg total) rectally at bedtime. 30 suppository 2  . mesalamine (LIALDA) 1.2 g EC tablet Take 3 tablets (3.6 g total) by mouth daily with breakfast. 270 tablet 1   No current facility-administered medications for this visit.    Allergies as of 10/14/2020  . (No Known Allergies)    ROS:  General: Negative for anorexia, weight loss, fever, chills, fatigue, weakness. ENT: Negative for hoarseness, difficulty swallowing , nasal congestion. CV: Negative for chest pain, angina, palpitations, dyspnea on exertion, peripheral edema.  Respiratory: Negative for dyspnea at rest, dyspnea on exertion, cough, sputum, wheezing.  GI: See history of present illness. GU:  Negative for dysuria, hematuria, urinary incontinence, urinary frequency,  nocturnal urination.  Endo: Negative for unusual weight change.    Physical Examination:   BP 125/72   Pulse 76   Temp 98.1 F (36.7 C) (Oral)   Ht 5' 11"  (1.803 m)   Wt 155 lb 6.4 oz (70.5 kg)   BMI 21.67 kg/m   General: Well-nourished, well-developed in no acute distress.  Eyes: No icterus. Conjunctivae pink. Mouth: Oropharyngeal mucosa moist and pink , no lesions erythema or exudate. Neck: Supple, Trachea midline Abdomen: Bowel sounds are normal, nontender, nondistended, no hepatosplenomegaly or masses, no abdominal bruits or hernia , no rebound or guarding.   Extremities: No lower extremity edema. No clubbing or deformities. Neuro: Alert and oriented x 3.  Grossly intact. Skin: Warm and dry, no jaundice.   Psych: Alert and cooperative, normal mood and affect.   Labs: CMP     Component Value Date/Time   NA 140 04/06/2020 0929   NA 143 03/03/2020 1556   NA 133 (L) 07/05/2013 0747   K 3.8 04/06/2020 0929   K 3.8 07/05/2013 0747   CL 107 04/06/2020 0929   CL 100 07/05/2013 0747   CO2 26 04/06/2020 0929   CO2 32 07/05/2013 0747   GLUCOSE 78 04/06/2020 0929   GLUCOSE 94 07/05/2013 0747   BUN 20 04/06/2020 0929   BUN 11 03/03/2020 1556   BUN 10 07/05/2013 0747   CREATININE 0.81 04/06/2020 0929   CREATININE 1.05 07/05/2013 0747   CALCIUM 9.1 04/06/2020 0929   CALCIUM 9.4 07/05/2013 0747   PROT 7.5  04/06/2020 0929   PROT 7.3 03/03/2020 1556   ALBUMIN 4.3 04/06/2020 0929   ALBUMIN 4.7 03/03/2020 1556   AST 17 04/06/2020 0929   ALT 14 04/06/2020 0929   ALKPHOS 40 04/06/2020 0929   BILITOT 0.5 04/06/2020 0929   BILITOT <0.2 03/03/2020 1556   GFRNONAA >60 04/06/2020 0929   GFRNONAA >60 07/05/2013 0747   GFRAA 106 03/03/2020 1556   GFRAA >60 07/05/2013 0747   Lab Results  Component Value Date   WBC 5.4 04/06/2020   HGB 12.4 (L) 04/06/2020   HCT 41.1 04/06/2020   MCV 75.0 (L) 04/06/2020   PLT 214 04/06/2020    Imaging Studies: No results  found.  Assessment and Plan:   [name] is a 45 y.o. y/o adult here for follow-up of ulcerative colitis  Patient remains in clinical, endoscopic and histologic remission Continue medications at current dose given previous history of recurrent symptoms on lower dose  We will update labs at this time to ensure normal kidney function and CBC on mesalamine products  Refill medications at this time  Follow-up in 3 months or sooner if new symptoms occur  Dr Cameron Payne

## 2020-10-15 LAB — COMPREHENSIVE METABOLIC PANEL
ALT: 7 IU/L (ref 0–44)
AST: 13 IU/L (ref 0–40)
Albumin/Globulin Ratio: 1.9 (ref 1.2–2.2)
Albumin: 4.6 g/dL (ref 4.0–5.0)
Alkaline Phosphatase: 50 IU/L (ref 44–121)
BUN/Creatinine Ratio: 14 (ref 9–20)
BUN: 12 mg/dL (ref 6–24)
Bilirubin Total: <0.2 mg/dL (ref 0.0–1.2)
CO2: 24 mmol/L (ref 20–29)
Calcium: 9.5 mg/dL (ref 8.7–10.2)
Chloride: 104 mmol/L (ref 96–106)
Creatinine, Ser: 0.86 mg/dL (ref 0.76–1.27)
Globulin, Total: 2.4 g/dL (ref 1.5–4.5)
Glucose: 86 mg/dL (ref 65–99)
Potassium: 4.6 mmol/L (ref 3.5–5.2)
Sodium: 142 mmol/L (ref 134–144)
Total Protein: 7 g/dL (ref 6.0–8.5)
eGFR: 110 mL/min/{1.73_m2} (ref >59)

## 2020-10-15 LAB — CBC
Hematocrit: 41.2 % (ref 37.5–51.0)
Hemoglobin: 12.4 g/dL — ABNORMAL LOW (ref 13.0–17.7)
MCH: 22.6 pg — ABNORMAL LOW (ref 26.6–33.0)
MCHC: 30.1 g/dL — ABNORMAL LOW (ref 31.5–35.7)
MCV: 75 fL — ABNORMAL LOW (ref 79–97)
Platelets: 209 10*3/uL (ref 150–450)
RBC: 5.48 x10E6/uL (ref 4.14–5.80)
RDW: 13 % (ref 11.6–15.4)
WBC: 6.9 10*3/uL (ref 3.4–10.8)

## 2020-12-23 ENCOUNTER — Other Ambulatory Visit: Payer: Self-pay

## 2021-01-20 ENCOUNTER — Other Ambulatory Visit: Payer: Self-pay

## 2021-01-20 ENCOUNTER — Encounter: Payer: Self-pay | Admitting: Gastroenterology

## 2021-01-20 ENCOUNTER — Ambulatory Visit (INDEPENDENT_AMBULATORY_CARE_PROVIDER_SITE_OTHER): Payer: Medicare Other | Admitting: Gastroenterology

## 2021-01-20 VITALS — BP 143/84 | HR 72 | Temp 98.0°F | Ht 71.0 in | Wt 158.8 lb

## 2021-01-20 DIAGNOSIS — K5100 Ulcerative (chronic) pancolitis without complications: Secondary | ICD-10-CM | POA: Diagnosis not present

## 2021-01-20 NOTE — Progress Notes (Signed)
[name], MD [address]  [place]  [place], [place] [address]  Main: [phone]  Fax: [phone]   Primary Care Physician: Elba Barman, MD   Chief complaint: Colitis  HPI: Cameron Payne is a 45 y.o. adult here for follow-up of ulcerative pancolitis.  Patient is currently in clinical, endoscopic and histologic remission on Lialda 3.6 g a day, and Canasa suppositories.  Reporting 2 formed bowel movements a day, without blood.  No abdominal pain, nausea or vomiting.  No weight loss.  No fever or chills.  No joint pains or vision changes.   ROS: All ROS reviewed and negative except as per HPI   Past Medical History:  Diagnosis Date   Developmental disability    Hypertension    Seizures ([place])    as a child    Past Surgical History:  Procedure Laterality Date   COLONOSCOPY WITH PROPOFOL N/A 11/22/2017   Procedure: COLONOSCOPY WITH PROPOFOL;  Surgeon: Virgel Manifold, MD;  Location: [facility];  Service: Endoscopy;  Laterality: N/A;   COLONOSCOPY WITH PROPOFOL N/A 01/10/2019   Procedure: COLONOSCOPY WITH PROPOFOL;  Surgeon: Virgel Manifold, MD;  Location: [facility];  Service: Endoscopy;  Laterality: N/A;   COLONOSCOPY WITH PROPOFOL N/A 07/09/2020   Procedure: COLONOSCOPY WITH PROPOFOL;  Surgeon: Virgel Manifold, MD;  Location: [facility];  Service: Endoscopy;  Laterality: N/A;   ESOPHAGOGASTRODUODENOSCOPY (EGD) WITH PROPOFOL N/A 11/22/2017   Procedure: ESOPHAGOGASTRODUODENOSCOPY (EGD) WITH PROPOFOL;  Surgeon: Virgel Manifold, MD;  Location: [facility];  Service: Endoscopy;  Laterality: N/A;    Prior to Admission medications   Medication Sig Start Date End Date Taking? Authorizing Provider  Ascorbic Acid (VITAMIN C ADULT GUMMIES) 125 MG CHEW Chew by mouth.   Yes [provider]  Calcium-Phosphorus-Vitamin D (CALCIUM/VITAMIN D3/ADULT GUMMY PO) Take by mouth.   Yes [provider]  ELDERBERRY PO Take 1  capsule by mouth 1 day or 1 dose.   Yes [provider]  fexofenadine (ALLEGRA) 180 MG tablet Take 180 mg by mouth daily.   Yes [provider]  folic acid (FOLVITE) 735 MCG tablet Take 1 tablet (400 mcg total) by mouth daily. 03/12/18  Yes Earlie Server, MD  mesalamine (CANASA) 1000 MG suppository Place 1 suppository (1,000 mg total) rectally at bedtime. 10/14/20  Yes Virgel Manifold, MD  mesalamine (LIALDA) 1.2 g EC tablet Take 2 tablets (2.4 g total) by mouth daily with breakfast. 01/20/21   Virgel Manifold, MD    Family History  Problem Relation Age of Onset   Diabetes Mother    Heart failure Mother    Cancer Father        liver   Cancer Paternal Jon Gills        does not know what kind   Cancer Maternal Aunt        breast (half sister) pt mother was adopted   Colon cancer Neg Hx    Thyroid disease Neg Hx      Social History   Tobacco Use   Smoking status: Former    Types: Cigars    Quit date: 06/11/2018    Years since quitting: 2.6   Smokeless tobacco: Never   Tobacco comments:    occ.   Vaping Use   Vaping Use: Never used  Substance Use Topics   Alcohol use: Yes    Alcohol/week: 2.0 standard drinks    Types: 2 Cans of beer per week  Comment: occ.   Drug use: Never    Allergies as of 01/20/2021   (No Known Allergies)    Physical Examination:  Constitutional: General:   Alert,  Well-developed, well-nourished, pleasant and cooperative in NAD BP (!) 143/84   Pulse 72   Temp 98 F (36.7 C) (Oral)   Ht 5' 11"  (1.803 m)   Wt 158 lb 12.8 oz (72 kg)   BMI 22.15 kg/m   Respiratory: Normal respiratory effort  Gastrointestinal:  Soft, non-tender and non-distended without masses, hepatosplenomegaly or hernias noted.  No guarding or rebound tenderness.     Cardiac: No clubbing or edema.  No cyanosis. Normal posterior tibial pedal pulses noted.  Psych:  Alert and cooperative. Normal mood and affect.  Musculoskeletal:  Normal gait. Head  normocephalic, atraumatic. Symmetrical without gross deformities. 5/5 Lower extremity strength bilaterally.  Skin: Warm. Intact without significant lesions or rashes. No jaundice.  Neck: Supple, trachea midline  Lymph: No cervical lymphadenopathy  Psych:  Alert and oriented x3, Alert and cooperative. Normal mood and affect.  Labs: CMP     Component Value Date/Time   NA 142 10/14/2020 1415   NA 133 (L) 07/05/2013 0747   K 4.6 10/14/2020 1415   K 3.8 07/05/2013 0747   CL 104 10/14/2020 1415   CL 100 07/05/2013 0747   CO2 24 10/14/2020 1415   CO2 32 07/05/2013 0747   GLUCOSE 86 10/14/2020 1415   GLUCOSE 78 04/06/2020 0929   GLUCOSE 94 07/05/2013 0747   BUN 12 10/14/2020 1415   BUN 10 07/05/2013 0747   CREATININE 0.86 10/14/2020 1415   CREATININE 1.05 07/05/2013 0747   CALCIUM 9.5 10/14/2020 1415   CALCIUM 9.4 07/05/2013 0747   PROT 7.0 10/14/2020 1415   ALBUMIN 4.6 10/14/2020 1415   AST 13 10/14/2020 1415   ALT 7 10/14/2020 1415   ALKPHOS 50 10/14/2020 1415   BILITOT <0.2 10/14/2020 1415   GFRNONAA >60 04/06/2020 0929   GFRNONAA >60 07/05/2013 0747   GFRAA 106 03/03/2020 1556   GFRAA >60 07/05/2013 0747   Lab Results  Component Value Date   WBC 6.9 10/14/2020   HGB 12.4 (L) 10/14/2020   HCT 41.2 10/14/2020   MCV 75 (L) 10/14/2020   PLT 209 10/14/2020    Imaging Studies:   Assessment and Plan:   EDRIS FRIEDT is a 45 y.o. y/o adult here for follow-up of ulcerative pancolitis  Patient in clinical remission Will decrease Lialda to 2 pills a day at this time If symptoms change, patient and mother advised to give [place] a call and they verbalized understanding Continue Canasa suppositories  If 2.4 g of Lialda a day keeps him in remission, will continue this long-term His most significant his disease was in his rectum, and therefore we will continue Canasa suppositories at this time.  Can consider discontinuing this in the future if patient remains asymptomatic  on Lialda 2.4 g a day.    Dr [name]

## 2021-04-02 ENCOUNTER — Other Ambulatory Visit: Payer: Self-pay

## 2021-04-02 DIAGNOSIS — D509 Iron deficiency anemia, unspecified: Secondary | ICD-10-CM

## 2021-04-05 ENCOUNTER — Other Ambulatory Visit: Payer: Self-pay

## 2021-04-05 ENCOUNTER — Inpatient Hospital Stay: Payer: Medicare Other | Attending: Oncology

## 2021-04-05 ENCOUNTER — Inpatient Hospital Stay (HOSPITAL_BASED_OUTPATIENT_CLINIC_OR_DEPARTMENT_OTHER): Payer: Medicare Other | Admitting: Oncology

## 2021-04-05 ENCOUNTER — Encounter: Payer: Self-pay | Admitting: Oncology

## 2021-04-05 VITALS — BP 135/85 | HR 70 | Temp 96.9°F | Wt 167.5 lb

## 2021-04-05 DIAGNOSIS — D563 Thalassemia minor: Secondary | ICD-10-CM | POA: Diagnosis present

## 2021-04-05 DIAGNOSIS — Z833 Family history of diabetes mellitus: Secondary | ICD-10-CM | POA: Insufficient documentation

## 2021-04-05 DIAGNOSIS — K6289 Other specified diseases of anus and rectum: Secondary | ICD-10-CM | POA: Insufficient documentation

## 2021-04-05 DIAGNOSIS — Z800 Family history of malignant neoplasm of digestive organs: Secondary | ICD-10-CM | POA: Diagnosis not present

## 2021-04-05 DIAGNOSIS — K51919 Ulcerative colitis, unspecified with unspecified complications: Secondary | ICD-10-CM | POA: Insufficient documentation

## 2021-04-05 DIAGNOSIS — D509 Iron deficiency anemia, unspecified: Secondary | ICD-10-CM | POA: Diagnosis present

## 2021-04-05 DIAGNOSIS — Z8249 Family history of ischemic heart disease and other diseases of the circulatory system: Secondary | ICD-10-CM | POA: Insufficient documentation

## 2021-04-05 DIAGNOSIS — Z803 Family history of malignant neoplasm of breast: Secondary | ICD-10-CM | POA: Diagnosis not present

## 2021-04-05 DIAGNOSIS — Z809 Family history of malignant neoplasm, unspecified: Secondary | ICD-10-CM | POA: Insufficient documentation

## 2021-04-05 LAB — RETIC PANEL
Immature Retic Fract: 14.4 % (ref 2.3–15.9)
RBC.: 5.26 MIL/uL (ref 4.22–5.81)
Retic Count, Absolute: 98.4 10*3/uL (ref 19.0–186.0)
Retic Ct Pct: 1.9 % (ref 0.4–3.1)
Reticulocyte Hemoglobin: 24.6 pg — ABNORMAL LOW (ref >27.9)

## 2021-04-05 LAB — CBC WITH DIFFERENTIAL/PLATELET
Abs Immature Granulocytes: 0.02 10*3/uL (ref 0.00–0.07)
Basophils Absolute: 0 10*3/uL (ref 0.0–0.1)
Basophils Relative: 0 %
Eosinophils Absolute: 0.2 10*3/uL (ref 0.0–0.5)
Eosinophils Relative: 2 %
HCT: 38.5 % — ABNORMAL LOW (ref 39.0–52.0)
Hemoglobin: 11.7 g/dL — ABNORMAL LOW (ref 13.0–17.0)
Immature Granulocytes: 0 %
Lymphocytes Relative: 26 %
Lymphs Abs: 2 10*3/uL (ref 0.7–4.0)
MCH: 22.8 pg — ABNORMAL LOW (ref 26.0–34.0)
MCHC: 30.4 g/dL (ref 30.0–36.0)
MCV: 75 fL — ABNORMAL LOW (ref 80.0–100.0)
Monocytes Absolute: 0.7 10*3/uL (ref 0.1–1.0)
Monocytes Relative: 9 %
Neutro Abs: 4.7 10*3/uL (ref 1.7–7.7)
Neutrophils Relative %: 63 %
Platelets: 209 10*3/uL (ref 150–400)
RBC: 5.13 MIL/uL (ref 4.22–5.81)
RDW: 13.4 % (ref 11.5–15.5)
WBC: 7.5 10*3/uL (ref 4.0–10.5)
nRBC: 0 % (ref 0.0–0.2)

## 2021-04-05 LAB — IRON AND TIBC
Iron: 74 ug/dL (ref 45–182)
Saturation Ratios: 26 % (ref 17.9–39.5)
TIBC: 287 ug/dL (ref 250–450)
UIBC: 213 ug/dL

## 2021-04-05 LAB — FERRITIN: Ferritin: 253 ng/mL (ref 24–336)

## 2021-04-05 NOTE — Progress Notes (Signed)
Hematology/Oncology follow up note [facility] Telephone:([phone] [phone]   Patient Care Team: Elba Barman, MD as PCP - General (Family Medicine)  REFERRING PROVIDER: Dr.Tahiliani Margretta Sidle REASON FOR VISIT Follow up for treatment of microcytic anemia  HISTORY OF PRESENTING ILLNESS:  Cameron Payne is a  45 y.o.  adult with PMH listed below who was referred to me for evaluation of microcytic anemia. Patient was diagnosed with ulcerative colitis after presenting with bloody stool in June 2019.  He has establish care with [place] gastroenterology Dr. Bonna Gains.  Colonoscopy in June 2016 2019 showed suspicion examination for rectal ulcerative colitis.  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 Patient currently is on mesalamine 800 mg 3 times daily, mesalamine suppository 1000 mg at bedtime. Reports that no more additional episodes of bloody stool.  Denies any abdominal pain or black stool. 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 Iron panel was all obtained on 11/08/2017 which showed iron level 67, TIBC 339, ferritin 504.  Patient was referred to me for additional work-up for his microcytic anemia. Patient denies any nausea vomiting, abdominal pain, chest pain, shortness of breath, weight loss.   A# 01/10/2019 colonoscopy findings erythematous mucosa in the cecum, transverse colon, ascending colon, sigmoid colon and rectum.  Biopsies were obtained.  Pathology showed chronic inflammation.  Active colitis.  INTERVAL HISTORY Cameron Payne is a 45 y.o. adult who has above history reviewed by me today presents for follow up visit for management of microcytic anemia/affect thalassemia minor  Problems and complaints are listed below: Patient is accompanied by  mother. Patient has no new complaints.  Patient recently had resolution of COVID improved. Patient is on mesalamine and follows up with gastroenterology.  . Review of Systems  Constitutional:  Negative for chills, fever, malaise/fatigue and weight loss.  HENT:  Negative for nosebleeds and sore throat.   Eyes:  Negative for double vision, photophobia and redness.  Respiratory:  Negative for cough, shortness of breath and wheezing.   Cardiovascular:  Negative for chest pain, palpitations, orthopnea and leg swelling.  Gastrointestinal:  Negative for abdominal pain, blood in stool, nausea and vomiting.  Genitourinary:  Negative for dysuria.  Musculoskeletal:  Negative for back pain, myalgias and neck pain.  Skin:  Negative for itching and rash.  Neurological:  Negative for dizziness, tingling and tremors.  Endo/Heme/Allergies:  Negative for environmental allergies. Does not bruise/bleed easily.  Psychiatric/Behavioral:  Negative for depression and hallucinations.    MEDICAL HISTORY:  Past Medical History:  Diagnosis Date   Developmental disability    Hypertension    Seizures ([place])    as a child    SURGICAL HISTORY: Past Surgical History:  Procedure Laterality Date   COLONOSCOPY WITH PROPOFOL N/A 11/22/2017   Procedure: COLONOSCOPY WITH PROPOFOL;  Surgeon: Virgel Manifold, MD;  Location: [facility];  Service: Endoscopy;  Laterality: N/A;   COLONOSCOPY WITH PROPOFOL N/A 01/10/2019   Procedure: COLONOSCOPY WITH PROPOFOL;  Surgeon: Virgel Manifold, MD;  Location: [facility];  Service: Endoscopy;  Laterality: N/A;   COLONOSCOPY WITH PROPOFOL N/A 07/09/2020   Procedure: COLONOSCOPY WITH PROPOFOL;  Surgeon: Virgel Manifold, MD;  Location: [facility];  Service: Endoscopy;  Laterality: N/A;   ESOPHAGOGASTRODUODENOSCOPY (EGD) WITH PROPOFOL N/A 11/22/2017   Procedure: ESOPHAGOGASTRODUODENOSCOPY (EGD) WITH PROPOFOL;  Surgeon: Virgel Manifold, MD;  Location: [facility]  ENDOSCOPY;  Service:  Endoscopy;  Laterality: N/A;    SOCIAL HISTORY: Social History   Socioeconomic History   Marital status: Single    Spouse name: Not on file   Number of children: Not on file   Years of education: Not on file   Highest education level: Not on file  Occupational History   Not on file  Tobacco Use   Smoking status: Former    Types: Cigars    Quit date: 06/11/2018    Years since quitting: 2.8   Smokeless tobacco: Never   Tobacco comments:    occ.   Vaping Use   Vaping Use: Never used  Substance and Sexual Activity   Alcohol use: Yes    Alcohol/week: 2.0 standard drinks    Types: 2 Cans of beer per week    Comment: occ.   Drug use: Never   Sexual activity: Not on file  Other Topics Concern   Not on file  Social History Narrative   Not on file   Social Determinants of Health   Financial Resource Strain: Not on file  Food Insecurity: Not on file  Transportation Needs: Not on file  Physical Activity: Not on file  Stress: Not on file  Social Connections: Not on file  Intimate Partner Violence: Not on file    FAMILY HISTORY: Family History  Problem Relation Age of Onset   Diabetes Mother    Heart failure Mother    Cancer Father        liver   Cancer Paternal Jon Gills        does not know what kind   Cancer Maternal Aunt        breast (half sister) pt mother was adopted   Colon cancer Neg Hx    Thyroid disease Neg Hx     ALLERGIES:  has No Known Allergies.  MEDICATIONS:  Current Outpatient Medications  Medication Sig Dispense Refill   Ascorbic Acid (VITAMIN C ADULT GUMMIES) 125 MG CHEW Chew by mouth.     Calcium-Phosphorus-Vitamin D (CALCIUM/VITAMIN D3/ADULT GUMMY PO) Take by mouth.     ELDERBERRY PO Take 1 capsule by mouth 1 day or 1 dose.     fexofenadine (ALLEGRA) 180 MG tablet Take 180 mg by mouth daily.     folic acid (FOLVITE) 503 MCG tablet Take 1 tablet (400 mcg total) by mouth daily. 90 tablet 1   mesalamine (CANASA) 1000 MG  suppository Place 1 suppository (1,000 mg total) rectally at bedtime. 30 suppository 2   mesalamine (LIALDA) 1.2 g EC tablet Take 2 tablets (2.4 g total) by mouth daily with breakfast. 180 tablet 1   No current facility-administered medications for this visit.     PHYSICAL EXAMINATION: ECOG PERFORMANCE STATUS: 0 - Asymptomatic Vitals:   04/05/21 0946  BP: 135/85  Pulse: 70  Temp: (!) 96.9 F (36.1 C)   Filed Weights   04/05/21 0946  Weight: 167 lb 8 oz (76 kg)    Physical Exam Constitutional:      General: She is not in acute distress. HENT:     Head: Normocephalic and atraumatic.  Eyes:     General: No scleral icterus.    Pupils: Pupils are equal, round, and reactive to light.  Cardiovascular:     Rate and Rhythm: Normal rate and regular rhythm.     Heart sounds: Normal heart sounds.  Pulmonary:     Effort: Pulmonary effort is normal. No respiratory distress.     Breath sounds: No wheezing.  Abdominal:  General: Bowel sounds are normal. There is no distension.     Palpations: Abdomen is soft. There is no mass.     Tenderness: There is no abdominal tenderness.  Musculoskeletal:        General: No deformity. Normal range of motion.     Cervical back: Normal range of motion and neck supple.  Skin:    General: Skin is warm and dry.     Findings: No erythema or rash.  Neurological:     Mental Status: She is alert and oriented to person, place, and time.     Cranial Nerves: No cranial nerve deficit.     Coordination: Coordination normal.  Psychiatric:        Behavior: Behavior normal.        Thought Content: Thought content normal.     LABORATORY DATA:  I have reviewed the data as listed Lab Results  Component Value Date   WBC 7.5 04/05/2021   HGB 11.7 (L) 04/05/2021   HCT 38.5 (L) 04/05/2021   MCV 75.0 (L) 04/05/2021   PLT 209 04/05/2021   Recent Labs    04/06/20 0929 10/14/20 1415  NA 140 142  K 3.8 4.6  CL 107 104  CO2 26 24  GLUCOSE 78 86   BUN 20 12  CREATININE 0.81 0.86  CALCIUM 9.1 9.5  GFRNONAA >60  --   PROT 7.5 7.0  ALBUMIN 4.3 4.6  AST 17 13  ALT 14 7  ALKPHOS 40 50  BILITOT 0.5 <0.2    Iron/TIBC/Ferritin/ %Sat    Component Value Date/Time   IRON 74 04/05/2021 0931   IRON 67 11/08/2017 1113   TIBC 287 04/05/2021 0931   TIBC 239 (L) 11/08/2017 1113   FERRITIN 253 04/05/2021 0931   FERRITIN 504 (H) 11/08/2017 1113   IRONPCTSAT 26 04/05/2021 0931   IRONPCTSAT 28 11/08/2017 1113        ASSESSMENT & PLAN:  1. Alpha thalassemia minor   2. Ulcerative colitis with complication, unspecified location (HCC)    Microcytic anemia, secondary to alpha thalassemia minor. Labs reviewed and discussed with patient. Hemoglobin slightly (baseline. Iron panel is normal.  No signs of iron deficiency. Recommend patient to repeat CBC in 3 months.  This could be secondary to recent vaccination. We discussed about option of him follow-up with primary care provider and being discharged from our clinic.  Patient and mother prefer to continue with annual evaluation at hematology clinic.  Ulcerative colitis, currently on mesalamine.  Follows up with gastroenterology.  Follow-up in 1 year.  Orders Placed This Encounter  Procedures   CBC with Differential/Platelet    Standing Status:   Future    Standing Expiration Date:   04/05/2022   CBC with Differential/Platelet    Standing Status:   Future    Standing Expiration Date:   04/05/2022   Comprehensive metabolic panel    Standing Status:   Future    Standing Expiration Date:   04/05/2022   Ferritin    Standing Status:   Future    Standing Expiration Date:   04/05/2022   Iron and TIBC    Standing Status:   Future    Standing Expiration Date:   04/05/2022  . Return of visit: 1 year  Earlie Server, MD, PhD Hematology Oncology 04/05/2021

## 2021-04-26 ENCOUNTER — Ambulatory Visit: Payer: Medicare Other | Admitting: Gastroenterology

## 2021-04-27 ENCOUNTER — Ambulatory Visit (INDEPENDENT_AMBULATORY_CARE_PROVIDER_SITE_OTHER): Payer: Medicare Other | Admitting: Gastroenterology

## 2021-04-27 VITALS — BP 137/86 | HR 63 | Temp 98.6°F | Wt 167.0 lb

## 2021-04-27 DIAGNOSIS — K5190 Ulcerative colitis, unspecified, without complications: Secondary | ICD-10-CM

## 2021-04-27 MED ORDER — MESALAMINE 1.2 G PO TBEC: 2.4000 g | DELAYED_RELEASE_TABLET | Freq: Every day | ORAL | 1 refills | Status: DC

## 2021-04-27 MED ORDER — MESALAMINE 1000 MG RE SUPP: 1000.0000 mg | Freq: Every day | RECTAL | 0 refills | Status: DC

## 2021-04-27 NOTE — Progress Notes (Signed)
[name], MD [address]  [place]  [place], [place] [address]  Main: [phone]  Fax: [phone]   Primary Care Physician: Elba Barman, MD   Chief complaint: Ulcerative colitis  HPI: Cameron Payne is a 45 y.o. adult with history of ulcerative pancolitis here for follow-up.  Presents with his mother.  They state that patient is having 2 formed bowel movements a day, without blood.  No abdominal pain, no nausea or vomiting.  Good appetite.  No weight loss.  Is compliant with Lialda, 2 pills a day, along with Canasa suppositories at bedtime.  Sees hematology for microcytic anemia due to alpha thalassemia minor.   ROS: All ROS reviewed and negative except as per HPI   Past Medical History:  Diagnosis Date   Developmental disability    Hypertension    Seizures ([place])    as a child    Past Surgical History:  Procedure Laterality Date   COLONOSCOPY WITH PROPOFOL N/A 11/22/2017   Procedure: COLONOSCOPY WITH PROPOFOL;  Surgeon: Virgel Manifold, MD;  Location: [facility];  Service: Endoscopy;  Laterality: N/A;   COLONOSCOPY WITH PROPOFOL N/A 01/10/2019   Procedure: COLONOSCOPY WITH PROPOFOL;  Surgeon: Virgel Manifold, MD;  Location: [facility];  Service: Endoscopy;  Laterality: N/A;   COLONOSCOPY WITH PROPOFOL N/A 07/09/2020   Procedure: COLONOSCOPY WITH PROPOFOL;  Surgeon: Virgel Manifold, MD;  Location: [facility];  Service: Endoscopy;  Laterality: N/A;   ESOPHAGOGASTRODUODENOSCOPY (EGD) WITH PROPOFOL N/A 11/22/2017   Procedure: ESOPHAGOGASTRODUODENOSCOPY (EGD) WITH PROPOFOL;  Surgeon: Virgel Manifold, MD;  Location: [facility];  Service: Endoscopy;  Laterality: N/A;    Prior to Admission medications   Medication Sig Start Date End Date Taking? Authorizing Provider  Ascorbic Acid (VITAMIN C ADULT GUMMIES) 125 MG CHEW Chew by mouth.   Yes [provider]  Calcium-Phosphorus-Vitamin D (CALCIUM/VITAMIN D3/ADULT  GUMMY PO) Take by mouth.   Yes [provider]  ELDERBERRY PO Take 1 capsule by mouth 1 day or 1 dose.   Yes [provider]  fexofenadine (ALLEGRA) 180 MG tablet Take 180 mg by mouth daily.   Yes [provider]  folic acid (FOLVITE) 017 MCG tablet Take 1 tablet (400 mcg total) by mouth daily. 03/12/18  Yes Earlie Server, MD  mesalamine (CANASA) 1000 MG suppository Place 1 suppository (1,000 mg total) rectally at bedtime. 10/14/20  Yes Virgel Manifold, MD  mesalamine (LIALDA) 1.2 g EC tablet Take 2 tablets (2.4 g total) by mouth daily with breakfast. 01/20/21  Yes Virgel Manifold, MD    Family History  Problem Relation Age of Onset   Diabetes Mother    Heart failure Mother    Cancer Father        liver   Cancer Paternal Jon Gills        does not know what kind   Cancer Maternal Aunt        breast (half sister) pt mother was adopted   Colon cancer Neg Hx    Thyroid disease Neg Hx      Social History   Tobacco Use   Smoking status: Former    Types: Cigars    Quit date: 06/11/2018    Years since quitting: 2.8   Smokeless tobacco: Never   Tobacco comments:    occ.   Vaping Use   Vaping Use: Never used  Substance Use Topics   Alcohol use: Yes    Alcohol/week: 2.0 standard drinks  Types: 2 Cans of beer per week    Comment: occ.   Drug use: Never    Allergies as of 04/27/2021   (No Known Allergies)    Physical Examination:  Constitutional: General:   Alert,  Well-developed, well-nourished, pleasant and cooperative in NAD BP 137/86   Pulse 63   Temp 98.6 F (37 C) (Oral)   Wt 167 lb (75.8 kg)   BMI 23.29 kg/m   Respiratory: Normal respiratory effort  Gastrointestinal:  Soft, non-tender and non-distended without masses, hepatosplenomegaly or hernias noted.  No guarding or rebound tenderness.     Cardiac: No clubbing or edema.  No cyanosis. Normal posterior tibial pedal pulses noted.  Psych:  Alert and cooperative. Normal mood  and affect.  Musculoskeletal:  Normal gait. Head normocephalic, atraumatic. Symmetrical without gross deformities. 5/5 Lower extremity strength bilaterally.  Skin: Warm. Intact without significant lesions or rashes. No jaundice.  Neck: Supple, trachea midline  Lymph: No cervical lymphadenopathy  Psych:  Alert and oriented x3, Alert and cooperative. Normal mood and affect.  Labs: CMP     Component Value Date/Time   NA 142 10/14/2020 1415   NA 133 (L) 07/05/2013 0747   K 4.6 10/14/2020 1415   K 3.8 07/05/2013 0747   CL 104 10/14/2020 1415   CL 100 07/05/2013 0747   CO2 24 10/14/2020 1415   CO2 32 07/05/2013 0747   GLUCOSE 86 10/14/2020 1415   GLUCOSE 78 04/06/2020 0929   GLUCOSE 94 07/05/2013 0747   BUN 12 10/14/2020 1415   BUN 10 07/05/2013 0747   CREATININE 0.86 10/14/2020 1415   CREATININE 1.05 07/05/2013 0747   CALCIUM 9.5 10/14/2020 1415   CALCIUM 9.4 07/05/2013 0747   PROT 7.0 10/14/2020 1415   ALBUMIN 4.6 10/14/2020 1415   AST 13 10/14/2020 1415   ALT 7 10/14/2020 1415   ALKPHOS 50 10/14/2020 1415   BILITOT <0.2 10/14/2020 1415   GFRNONAA >60 04/06/2020 0929   GFRNONAA >60 07/05/2013 0747   GFRAA 106 03/03/2020 1556   GFRAA >60 07/05/2013 0747   Lab Results  Component Value Date   WBC 7.5 04/05/2021   HGB 11.7 (L) 04/05/2021   HCT 38.5 (L) 04/05/2021   MCV 75.0 (L) 04/05/2021   PLT 209 04/05/2021    Imaging Studies:   Assessment and Plan:   Cameron Payne is a 46 y.o. y/o adult here for follow-up of ulcerative pancolitis, doing well on current regimen and in endoscopic and histologic remission on most recent colonoscopy in February 2022  Lialda dose was decreased to 2 pills a day on last visit and patient has not had any increase in symptoms despite this.  Continue Canasa suppositories at bedtime.  Patient and mother state they do not have any problems taking the suppository.  Recent CBC reviewed.  We will check CMP, and inflammatory markers  including fecal calprotectin  Given that patient had more significant disease in the rectum than the rest of the colon, okay to continue Canasa suppositories at this time.  Patient is now on less than maximal dose of Lialda, therefore combination therapy with Lialda and Canasa together is appropriate at this time.  Benefits of this regimen together outweigh risks in keeping patient in remission.  If patient stays in remission , Canasa suppository can potentially be discontinued after 3 to 6 months  Dr [name]

## 2021-04-28 ENCOUNTER — Other Ambulatory Visit: Payer: Self-pay | Admitting: Gastroenterology

## 2021-04-28 LAB — COMPREHENSIVE METABOLIC PANEL
ALT: 11 IU/L (ref 0–44)
AST: 15 IU/L (ref 0–40)
Albumin/Globulin Ratio: 1.7 (ref 1.2–2.2)
Albumin: 4.4 g/dL (ref 4.0–5.0)
Alkaline Phosphatase: 49 IU/L (ref 44–121)
BUN/Creatinine Ratio: 14 (ref 9–20)
BUN: 12 mg/dL (ref 6–24)
Bilirubin Total: 0.2 mg/dL (ref 0.0–1.2)
CO2: 23 mmol/L (ref 20–29)
Calcium: 9.5 mg/dL (ref 8.7–10.2)
Chloride: 105 mmol/L (ref 96–106)
Creatinine, Ser: 0.85 mg/dL (ref 0.76–1.27)
Globulin, Total: 2.6 g/dL (ref 1.5–4.5)
Glucose: 80 mg/dL (ref 70–99)
Potassium: 4.1 mmol/L (ref 3.5–5.2)
Sodium: 143 mmol/L (ref 134–144)
Total Protein: 7 g/dL (ref 6.0–8.5)
eGFR: 110 mL/min/{1.73_m2} (ref >59)

## 2021-04-28 LAB — SEDIMENTATION RATE: Sed Rate: 16 mm/hr — ABNORMAL HIGH (ref 0–15)

## 2021-04-28 LAB — C-REACTIVE PROTEIN: CRP: <1 mg/L (ref 0–10)

## 2021-05-01 LAB — CALPROTECTIN, FECAL: Calprotectin, Fecal: 135 ug/g — ABNORMAL HIGH (ref 0–120)

## 2021-06-29 ENCOUNTER — Other Ambulatory Visit: Payer: Self-pay | Admitting: *Deleted

## 2021-06-29 DIAGNOSIS — D563 Thalassemia minor: Secondary | ICD-10-CM

## 2021-06-29 DIAGNOSIS — D509 Iron deficiency anemia, unspecified: Secondary | ICD-10-CM

## 2021-07-07 ENCOUNTER — Inpatient Hospital Stay: Payer: Medicare Other | Attending: Oncology

## 2021-07-07 ENCOUNTER — Other Ambulatory Visit: Payer: Self-pay

## 2021-07-07 DIAGNOSIS — D563 Thalassemia minor: Secondary | ICD-10-CM | POA: Insufficient documentation

## 2021-07-07 LAB — CBC WITH DIFFERENTIAL/PLATELET
Abs Immature Granulocytes: 0.03 10*3/uL (ref 0.00–0.07)
Basophils Absolute: 0 10*3/uL (ref 0.0–0.1)
Basophils Relative: 0 %
Eosinophils Absolute: 0.1 10*3/uL (ref 0.0–0.5)
Eosinophils Relative: 1 %
HCT: 42.9 % (ref 39.0–52.0)
Hemoglobin: 12.7 g/dL — ABNORMAL LOW (ref 13.0–17.0)
Immature Granulocytes: 0 %
Lymphocytes Relative: 30 %
Lymphs Abs: 2.1 10*3/uL (ref 0.7–4.0)
MCH: 22.5 pg — ABNORMAL LOW (ref 26.0–34.0)
MCHC: 29.6 g/dL — ABNORMAL LOW (ref 30.0–36.0)
MCV: 76.1 fL — ABNORMAL LOW (ref 80.0–100.0)
Monocytes Absolute: 0.6 10*3/uL (ref 0.1–1.0)
Monocytes Relative: 8 %
Neutro Abs: 4.2 10*3/uL (ref 1.7–7.7)
Neutrophils Relative %: 61 %
Platelets: 228 10*3/uL (ref 150–400)
RBC: 5.64 MIL/uL (ref 4.22–5.81)
RDW: 13.8 % (ref 11.5–15.5)
WBC: 7 10*3/uL (ref 4.0–10.5)
nRBC: 0 % (ref 0.0–0.2)

## 2021-08-18 ENCOUNTER — Encounter: Payer: Self-pay | Admitting: Gastroenterology

## 2021-08-18 ENCOUNTER — Ambulatory Visit (INDEPENDENT_AMBULATORY_CARE_PROVIDER_SITE_OTHER): Payer: Medicare Other | Admitting: Gastroenterology

## 2021-08-18 ENCOUNTER — Other Ambulatory Visit: Payer: Self-pay

## 2021-08-18 VITALS — BP 138/84 | HR 71 | Temp 98.1°F | Ht 71.0 in | Wt 171.0 lb

## 2021-08-18 DIAGNOSIS — K5100 Ulcerative (chronic) pancolitis without complications: Secondary | ICD-10-CM

## 2021-08-18 NOTE — Progress Notes (Signed)
ul ?

## 2021-08-18 NOTE — Progress Notes (Signed)
?  ?Cephas Darby, MD ?[address]  ?[address]  ?[place], [place] [address]  ?Main: [phone]  ?Fax: [phone] ? ? ? ?Gastroenterology Consultation ? ?Referring Provider:     Elba Barman, MD ?Primary Care Physician:  Elba Barman, MD ?Primary Gastroenterologist:  Dr. Bonna Gains ?Reason for Consultation:     Ulcerative colitis ?      ? HPI:   ?Cameron Payne is a 46 y.o. adult referred by Dr. Elba Barman, MD  for consultation & management of ulcerative colitis, currently maintained on Lialda 2.4 g daily and Canasa suppository daily.  Patient denies any GI symptoms today.  He is gaining weight.  Patient is accompanied by his mother ? ?IBD diagnosis: Diagnosed with ulcerative pancolitis based on the index colonoscopy on 11/22/2017 ? ?Disease course: Clinical presentation at the time of diagnosis include rectal bleeding, loose stools mixed with blood, weight loss in May 2019, underwent upper endoscopy as well as colonoscopy.  EGD was normal.  Colonoscopy revealed mild erythema in the cecum and rectum, pathology revealed mild active colitis in the cecum, moderate active colitis in the rectum with crypt abscess.  Patient was started on Canasa suppository as well as oral mesalamine since diagnosis.  Fecal calprotectin levels at the time of diagnosis were 126, peaked at 483 in 6/21.  Follow-up colonoscopy revealed mild chronic active colitis in the entire colon.  Patient has been maintained on Lialda 2.4 g daily.  Subsequently, fecal calprotectin levels have normalized.  Most recent fecal calprotectin levels increased to 135 in 03/2021. ? ?Extra intestinal manifestations: None ? ?IBD surgical history: None ? ?Imaging: ? ?MRE none ?CTE none ?SBFT none ? ?Procedures: ?Colonoscopy 11/22/2017 ?- The examination was suspicious for ulcerative proctitis ?- The examined portion of the ileum was normal. Biopsied. ?- Erythematous mucosa at the appendiceal orifice. Biopsied. ?- Patchy mild mucosal changes were  found in the rectum, rule out ulcerative colitis. Biopsied. ?- The sigmoid colon, descending colon, transverse colon, ascending colon and cecum are normal. Biopsied. ?- The distal rectum and anal verge are normal on retroflexion view. ? ?DIAGNOSIS:  ? ?B.  TERMINAL ILEUM; COLD BIOPSY:  ?- SMALL BOWEL MUCOSA WITH INTACT VILLOUS ARCHITECTURE.  ?- NEGATIVE FOR INTRAEPITHELIAL LYMPHOCYTOSIS, DYSPLASIA AND MALIGNANCY.  ? ?C.  COLON, APPENDICEAL ORIFICE; COLD BIOPSY:  ?- MILD ACTIVE COLITIS.  ?- NEGATIVE FOR DYSPLASIA AND MALIGNANCY.  ? ?D.  COLON, CECUM AND ASCENDING; COLD BIOPSY:  ?- MUCOSAL EDEMA AND PROMINENT LYMPHOID AGGREGATES.  ?- NEGATIVE FOR DYSPLASIA AND MALIGNANCY.  ? ?E.  TRANSVERSE COLON; COLD BIOPSY:  ?- MUCOSAL HEMORRHAGE AND PROMINENT LYMPHOID AGGREGATES.  ?- NEGATIVE FOR DYSPLASIA AND MALIGNANCY.  ? ?F.  COLON, DESCENDING AND SIGMOID; COLD BIOPSY:  ?- MUCOSAL HEMORRHAGE.  ?- NEGATIVE FOR DYSPLASIA AND MALIGNANCY.  ? ?G.  RECTUM; COLD BIOPSY:  ?- MODERATE ACTIVE COLITIS/PROCTITIS WITH ARCHITECTURAL FEATURES OF  ?CHRONICITY.  ?- NEGATIVE FOR DYSPLASIA AND MALIGNANCY.  ? ?Colonoscopy 01/10/2019 ?- Erythematous mucosa in the cecum. Biopsied. ?- Erythematous mucosa in the transverse colon and in the ascending colon. Biopsied. ?- Erythematous mucosa in the sigmoid colon. Biopsied. ?- Erythematous mucosa in the rectum. Biopsied. ?- The distal rectum and anal verge are normal on retroflexion view. ?- Biopsies were obtained in the descending colon, in the transverse colon and in the ascending colon. ?- The areas of erythema were in the periappendiceal region of the cecum, very patchy erythema in the ascending, ?transverse and sigmoid colon. Mild pathcy erythema in the  rectum. Rest of the colon was normal. ?DIAGNOSIS:  ?A.  CECUM ERYTHEMA; COLD BIOPSY:  ?- MILD TO MODERATE CHRONIC ACTIVE COLITIS IN 2 OF 3 FRAGMENTS.  ? ?B.  ASCENDING, TRANSVERSE, AND DESCENDING COLON; COLD BIOPSY:  ?- MILD CHRONIC ACTIVE COLITIS  INVOLVING 1 OF 10 FRAGMENTS.  ? ?C.  SIGMOID COLON ERYTHEMA; COLD BIOPSY:  ?- MILD CHRONIC ACTIVE COLITIS IN 2 OF 6 FRAGMENTS.  ? ?D.  RECTUM ERYTHEMA; COLD BIOPSY:  ?- MILD CHRONIC ACTIVE PROCTITIS IN 2 OF 2 FRAGMENTS.  ? ?Colonoscopy 07/09/2020 ?- One 4 mm polyp in the descending colon, removed with a cold biopsy forceps. Resected and retrieved. ?- The examination was otherwise normal. ?- The rectum, sigmoid colon, descending colon, transverse colon, ascending colon, cecum and terminal ileum are ?normal. Biopsied. ?- The distal rectum and anal verge are normal on retroflexion view. ?DIAGNOSIS:  ?A.  TERMINAL ILEUM; COLD BIOPSY:  ?- ILEAL MUCOSA WITH INTACT VILLI.  ?- NEGATIVE FOR ACTIVE INFLAMMATION, INTRAEPITHELIAL LYMPHOCYTOSIS, AND  ?INFECTIOUS AGENTS.  ? ?B.  CECUM; COLD BIOPSY:  ?- COLONIC MUCOSA WITH INTACT CRYPT ARCHITECTURE.  ?- NO EVIDENCE OF COLITIS  ? ?C.  ASCENDING COLON; COLD BIOPSY:  ?- COLONIC MUCOSA WITH INTACT CRYPT ARCHITECTURE.  ?- NO EVIDENCE OF COLITIS.  ? ?D.  TRANSVERSE COLON; COLD BIOPSY:  ?- ONE OF 2 FRAGMENTS WITH FEATURES OF CRYPT DROPOUT.  ?- NO EVIDENCE OF ACTIVE INFLAMMATION  ? ?E.  DESCENDING COLON; COLD BIOPSY:  ?- TWO FRAGMENTS WITH FEATURES OF CRYPT DROPOUT.  ?- NO EVIDENCE OF ACTIVE INFLAMMATION.  ? ?F.  COLON POLYP, DESCENDING; COLD BIOPSY:  ?- TUBULAR ADENOMA.  ?- NEGATIVE FOR HIGH-GRADE DYSPLASIA AND MALIGNANCY.  ? ?G.  SIGMOID COLON; COLD BIOPSY:  ?- MILDLY ALTERED CRYPT ARCHITECTURE AND MILD INFILTRATES OF PLASMA CELLS  ?AND EOSINOPHILS EXTENDING TO THE CRYPT BASES, SEE COMMENT.  ?- NO EVIDENCE OF ACTIVE INFLAMMATION.  ? ?H.  RECTUM; COLD BIOPSY:  ?- MILDLY ALTERED CRYPT ARCHITECTURE WITHOUT SIGNIFICANT CHRONIC  ?INFLAMMATION.  ?- NO EVIDENCE OF ACTIVE INFLAMMATION.  ? ?Upper Endoscopy 11/22/2017 ?- White nummular lesions in esophageal mucosa. Brushings performed. ?- Normal stomach. ?- Normal duodenal bulb, second portion of the duodenum and examined duodenum. Biopsied. ?A.   DUODENUM, SECOND PORTION END BULB; COLD BIOPSY:  ?- DUODENAL MUCOSA WITH PRESERVED VILLOUS ARCHITECTURE AND FOCAL  ?BRUNNER'S GLAND HYPERPLASIA.  ?- NEGATIVE FOR INTRA-EPITHELIAL LYMPHOCYTOSIS, DYSPLASIA AND MALIGNANCY.  ? ?VCE none ? ?IBD medications: ? ?Steroids: None  ?5-ASA: Lialda 2.4 g daily since 10/2017, Canasa suppository at bedtime since 10/2017, at the time of diagnosis ?Immunomodulators: AZA, methotrexate ?TPMT status unknown ?Biologics: Na?ve ?Anti TNFs: ?Anti Integrins: ?Ustekinumab: ?Tofactinib: ?Clinical trial: ? ?NSAIDs: None ? ?Antiplts/Anticoagulants/Anti thrombotics: None ? ?Past Medical History:  ?Diagnosis Date  ? Developmental disability   ? Hypertension   ? Seizures ([place])   ? as a child  ? ? ?Past Surgical History:  ?Procedure Laterality Date  ? COLONOSCOPY WITH PROPOFOL N/A 11/22/2017  ? Procedure: COLONOSCOPY WITH PROPOFOL;  Surgeon: Virgel Manifold, MD;  Location: [facility];  Service: Endoscopy;  Laterality: N/A;  ? COLONOSCOPY WITH PROPOFOL N/A 01/10/2019  ? Procedure: COLONOSCOPY WITH PROPOFOL;  Surgeon: Virgel Manifold, MD;  Location: [facility];  Service: Endoscopy;  Laterality: N/A;  ? COLONOSCOPY WITH PROPOFOL N/A 07/09/2020  ? Procedure: COLONOSCOPY WITH PROPOFOL;  Surgeon: Virgel Manifold, MD;  Location: [facility];  Service: Endoscopy;  Laterality: N/A;  ? ESOPHAGOGASTRODUODENOSCOPY (EGD) WITH PROPOFOL N/A 11/22/2017  ? Procedure:  ESOPHAGOGASTRODUODENOSCOPY (EGD) WITH PROPOFOL;  Surgeon: Virgel Manifold, MD;  Location: [facility] END[facility];  Service: Endoscopy;  Laterality: N/A;  ? ?Current Outpatient Medications:  ?  Ascorbic Acid (VITAMIN C ADULT GUMMIES) 125 MG CHEW, Chew by mouth., Disp: , Rfl:  ?  Calcium-Phosphorus-Vitamin D (CALCIUM/VITAMIN D3/ADULT GUMMY PO), Take by mouth., Disp: , Rfl:  ?  ELDERBERRY PO, Take 1 capsule by mouth 1 day or 1 dose., Disp: , Rfl:  ?  fexofenadine (ALLEGRA) 180 MG tablet, Take 180 mg by mouth daily., Disp: , Rfl:  ?  folic  acid (FOLVITE) 563 MCG tablet, Take 1 tablet (400 mcg total) by mouth daily., Disp: 90 tablet, Rfl: 1 ?  mesalamine (CANASA) 1000 MG suppository, Place 1 suppository (1,000 mg total) rectally at bedtime.

## 2021-09-01 LAB — CALPROTECTIN, FECAL: Calprotectin, Fecal: 38 ug/g (ref 0–120)

## 2021-09-06 ENCOUNTER — Telehealth: Payer: Self-pay

## 2021-09-06 NOTE — Telephone Encounter (Signed)
Called and left a message for call back  

## 2021-09-06 NOTE — Telephone Encounter (Signed)
-----   Message from Lin Landsman, MD sent at 09/06/2021 12:10 AM EDT ----- ?Please inform patient that the fecal calprotectin levels came back normal.  I will see him in 6 months.  He should continue taking Lialda and the cutting dose ? ?RV ?

## 2021-09-08 NOTE — Telephone Encounter (Signed)
Called and left a message for call back sent lett to patient  ?

## 2021-09-16 NOTE — Telephone Encounter (Signed)
Pt returned call left message ?

## 2021-10-12 ENCOUNTER — Telehealth: Payer: Self-pay

## 2021-10-12 NOTE — Telephone Encounter (Signed)
[facility] is requesting a refill on the Mesalamine 112m suppositories  ?Last office visit 08/18/21 Ulcerative Pancolitis  ?Plan: Ulcerative pancolitis: Quiescent based on the colonoscopy in 06/2020 ?In clinical remission, however patient has elevated fecal calprotectin levels in 03/2021 ?Therefore, repeat fecal calprotectin levels ?Continue Lialda 2.4 g daily, okay to stop Canasa suppository at this time ? suppose to follow in 6 months  ?

## 2022-02-21 ENCOUNTER — Ambulatory Visit: Payer: Medicare Other | Admitting: Gastroenterology

## 2022-02-24 ENCOUNTER — Telehealth: Payer: Self-pay

## 2022-02-24 NOTE — Telephone Encounter (Signed)
Patient had a appointment schedule for 02/21/2022  in [place]. Patient caregiver did not want to drive to [facility] because she did not know where [place] was. We tried to explain where our [place] office was but they only want to be schedule in [place]. Do you want to over book patient or go to next available

## 2022-02-24 NOTE — Telephone Encounter (Signed)
No need to overbook, his ulcerative colitis is quiescent, I can see him in [place] next available  RV

## 2022-03-08 ENCOUNTER — Ambulatory Visit: Payer: Medicare Other | Admitting: Gastroenterology

## 2022-04-04 ENCOUNTER — Ambulatory Visit: Payer: Medicare Other | Admitting: Gastroenterology

## 2022-04-05 ENCOUNTER — Other Ambulatory Visit: Payer: Medicare Other

## 2022-04-05 ENCOUNTER — Inpatient Hospital Stay: Payer: Medicare Other | Attending: Oncology

## 2022-04-05 DIAGNOSIS — D509 Iron deficiency anemia, unspecified: Secondary | ICD-10-CM | POA: Diagnosis not present

## 2022-04-05 DIAGNOSIS — Z833 Family history of diabetes mellitus: Secondary | ICD-10-CM | POA: Insufficient documentation

## 2022-04-05 DIAGNOSIS — K5190 Ulcerative colitis, unspecified, without complications: Secondary | ICD-10-CM | POA: Diagnosis not present

## 2022-04-05 DIAGNOSIS — Z800 Family history of malignant neoplasm of digestive organs: Secondary | ICD-10-CM | POA: Insufficient documentation

## 2022-04-05 DIAGNOSIS — Z809 Family history of malignant neoplasm, unspecified: Secondary | ICD-10-CM | POA: Insufficient documentation

## 2022-04-05 DIAGNOSIS — D563 Thalassemia minor: Secondary | ICD-10-CM | POA: Diagnosis not present

## 2022-04-05 DIAGNOSIS — Z7289 Other problems related to lifestyle: Secondary | ICD-10-CM | POA: Diagnosis not present

## 2022-04-05 DIAGNOSIS — Z8249 Family history of ischemic heart disease and other diseases of the circulatory system: Secondary | ICD-10-CM | POA: Diagnosis not present

## 2022-04-05 DIAGNOSIS — Z87891 Personal history of nicotine dependence: Secondary | ICD-10-CM | POA: Insufficient documentation

## 2022-04-05 DIAGNOSIS — K6289 Other specified diseases of anus and rectum: Secondary | ICD-10-CM | POA: Diagnosis not present

## 2022-04-05 DIAGNOSIS — Z803 Family history of malignant neoplasm of breast: Secondary | ICD-10-CM | POA: Diagnosis not present

## 2022-04-05 DIAGNOSIS — Z79899 Other long term (current) drug therapy: Secondary | ICD-10-CM | POA: Insufficient documentation

## 2022-04-05 LAB — CBC WITH DIFFERENTIAL/PLATELET
Abs Immature Granulocytes: 0.02 10*3/uL (ref 0.00–0.07)
Basophils Absolute: 0 10*3/uL (ref 0.0–0.1)
Basophils Relative: 0 %
Eosinophils Absolute: 0.1 10*3/uL (ref 0.0–0.5)
Eosinophils Relative: 1 %
HCT: 42 % (ref 39.0–52.0)
Hemoglobin: 12.7 g/dL — ABNORMAL LOW (ref 13.0–17.0)
Immature Granulocytes: 0 %
Lymphocytes Relative: 23 %
Lymphs Abs: 1.6 10*3/uL (ref 0.7–4.0)
MCH: 22.6 pg — ABNORMAL LOW (ref 26.0–34.0)
MCHC: 30.2 g/dL (ref 30.0–36.0)
MCV: 74.7 fL — ABNORMAL LOW (ref 80.0–100.0)
Monocytes Absolute: 0.5 10*3/uL (ref 0.1–1.0)
Monocytes Relative: 7 %
Neutro Abs: 4.6 10*3/uL (ref 1.7–7.7)
Neutrophils Relative %: 69 %
Platelets: 225 10*3/uL (ref 150–400)
RBC: 5.62 MIL/uL (ref 4.22–5.81)
RDW: 13.5 % (ref 11.5–15.5)
WBC: 6.8 10*3/uL (ref 4.0–10.5)
nRBC: 0 % (ref 0.0–0.2)

## 2022-04-05 LAB — COMPREHENSIVE METABOLIC PANEL
ALT: 16 U/L (ref 0–44)
AST: 21 U/L (ref 15–41)
Albumin: 4.4 g/dL (ref 3.5–5.0)
Alkaline Phosphatase: 48 U/L (ref 38–126)
Anion gap: 7 (ref 5–15)
BUN: 14 mg/dL (ref 6–20)
CO2: 25 mmol/L (ref 22–32)
Calcium: 9 mg/dL (ref 8.9–10.3)
Chloride: 106 mmol/L (ref 98–111)
Creatinine, Ser: 0.78 mg/dL (ref 0.61–1.24)
GFR, Estimated: >60 mL/min (ref >60)
Glucose, Bld: 110 mg/dL — ABNORMAL HIGH (ref 70–99)
Potassium: 3.6 mmol/L (ref 3.5–5.1)
Sodium: 138 mmol/L (ref 135–145)
Total Bilirubin: 0.4 mg/dL (ref 0.3–1.2)
Total Protein: 7.7 g/dL (ref 6.5–8.1)

## 2022-04-05 LAB — IRON AND TIBC
Iron: 59 ug/dL (ref 45–182)
Saturation Ratios: 22 % (ref 17.9–39.5)
TIBC: 274 ug/dL (ref 250–450)
UIBC: 215 ug/dL

## 2022-04-05 LAB — FERRITIN: Ferritin: 319 ng/mL (ref 24–336)

## 2022-04-06 ENCOUNTER — Inpatient Hospital Stay (HOSPITAL_BASED_OUTPATIENT_CLINIC_OR_DEPARTMENT_OTHER): Payer: Medicare Other | Admitting: Oncology

## 2022-04-06 ENCOUNTER — Encounter: Payer: Self-pay | Admitting: Oncology

## 2022-04-06 VITALS — BP 145/91 | HR 66 | Temp 97.7°F | Resp 18 | Wt 163.7 lb

## 2022-04-06 DIAGNOSIS — K51919 Ulcerative colitis, unspecified with unspecified complications: Secondary | ICD-10-CM

## 2022-04-06 DIAGNOSIS — D563 Thalassemia minor: Secondary | ICD-10-CM

## 2022-04-06 NOTE — Progress Notes (Signed)
Hematology/Oncology Progress note [phone] [phone]      Patient Care Team: Elba Barman, MD as PCP - General (Family Medicine)  ASSESSMENT & PLAN:   Alpha thalassemia minor Microcytic anemia, secondary to alpha thalassemia minor. Labs reviewed and discussed with patient and his mother. . Stable hemoglobin, close to his baseline. Continue folic acid supplementation.  Iron panel is normal.  No signs of iron deficiency.  We discussed about option of him follow-up with primary care provider and being discharged from our clinic.  Patient and mother prefer to continue with annual evaluation at hematology clinic.  Ulcerative colitis ([place])  currently on mesalamine.  Follows up with gastroenterology.   Orders Placed This Encounter  Procedures   CBC with Differential/Platelet    Standing Status:   Future    Standing Expiration Date:   04/07/2023   Comprehensive metabolic panel    Standing Status:   Future    Standing Expiration Date:   04/06/2023   Iron and TIBC    Standing Status:   Future    Standing Expiration Date:   04/07/2023   Ferritin    Standing Status:   Future    Standing Expiration Date:   04/07/2023   Follow up in 1 year.   All questions were answered. The patient knows to call the clinic with any problems, questions or concerns.  Earlie Server, MD, PhD [facility] Hematology Oncology 04/06/2022   REASON FOR VISIT Follow up for treatment of microcytic anemia  HISTORY OF PRESENTING ILLNESS:  Cameron Payne is a  46 y.o.  adult with PMH listed below who was referred to me for evaluation of microcytic anemia. Patient was diagnosed with ulcerative colitis after presenting with bloody stool in June 2019.  He has establish care with [place] [facility] Dr. Bonna Gains.  Colonoscopy in June 2016 2019 showed suspicion examination for rectal ulcerative colitis.  EGD showed white Neumiller lesion in the esophageal mucosa otherwise  normal. 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 Patient currently is on mesalamine 800 mg 3 times daily, mesalamine suppository 1000 mg at bedtime. Reports that no more additional episodes of bloody stool.  Denies any abdominal pain or black stool. 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 Iron panel was all obtained on 11/08/2017 which showed iron level 67, TIBC 339, ferritin 504.  Patient was referred to me for additional work-up for his microcytic anemia. Patient denies any nausea vomiting, abdominal pain, chest pain, shortness of breath, weight loss.   A# 01/10/2019 colonoscopy findings erythematous mucosa in the cecum, transverse colon, ascending colon, sigmoid colon and rectum.  Biopsies were obtained.  Pathology showed chronic inflammation.  Active colitis.  INTERVAL HISTORY Cameron Payne is a 46 y.o. adult who has above history reviewed by me today presents for follow up visit for management of microcytic anemia/affect thalassemia minor  Problems and complaints are listed below: Patient is accompanied by mother. Patient has no new complaints.  He feels well, stable weight.  Patient is on mesalamine and follows up with gastroenterology.  . Review of Systems  Unable to perform ROS: Other  Constitutional:  Negative for chills, fever, malaise/fatigue and weight loss.  Eyes:  Negative for double vision, photophobia and redness.  Respiratory:  Negative for cough, shortness of breath and wheezing.   Cardiovascular:  Negative for chest pain, palpitations, orthopnea and leg swelling.  Gastrointestinal:  Negative for abdominal pain, blood in stool, nausea and vomiting.  Genitourinary:  Negative for hematuria.  Musculoskeletal:  Negative for joint pain.  Neurological:  Negative for dizziness.  Endo/Heme/Allergies:  Negative for environmental allergies. Does not bruise/bleed easily.   Psychiatric/Behavioral:  Negative for hallucinations.     MEDICAL HISTORY:  Past Medical History:  Diagnosis Date   Developmental disability    Hypertension    Seizures ([place])    as a child    SURGICAL HISTORY: Past Surgical History:  Procedure Laterality Date   COLONOSCOPY WITH PROPOFOL N/A 11/22/2017   Procedure: COLONOSCOPY WITH PROPOFOL;  Surgeon: Virgel Manifold, MD;  Location: [facility];  Service: Endoscopy;  Laterality: N/A;   COLONOSCOPY WITH PROPOFOL N/A 01/10/2019   Procedure: COLONOSCOPY WITH PROPOFOL;  Surgeon: Virgel Manifold, MD;  Location: [facility];  Service: Endoscopy;  Laterality: N/A;   COLONOSCOPY WITH PROPOFOL N/A 07/09/2020   Procedure: COLONOSCOPY WITH PROPOFOL;  Surgeon: Virgel Manifold, MD;  Location: [facility];  Service: Endoscopy;  Laterality: N/A;   ESOPHAGOGASTRODUODENOSCOPY (EGD) WITH PROPOFOL N/A 11/22/2017   Procedure: ESOPHAGOGASTRODUODENOSCOPY (EGD) WITH PROPOFOL;  Surgeon: Virgel Manifold, MD;  Location: [facility];  Service: Endoscopy;  Laterality: N/A;    SOCIAL HISTORY: Social History   Socioeconomic History   Marital status: Single    Spouse name: Not on file   Number of children: Not on file   Years of education: Not on file   Highest education level: Not on file  Occupational History   Not on file  Tobacco Use   Smoking status: Former    Types: Cigars    Quit date: 06/11/2018    Years since quitting: 3.8   Smokeless tobacco: Never   Tobacco comments:    occ.   Vaping Use   Vaping Use: Never used  Substance and Sexual Activity   Alcohol use: Yes    Alcohol/week: 2.0 standard drinks of alcohol    Types: 2 Cans of beer per week    Comment: occ.   Drug use: Never   Sexual activity: Not on file  Other Topics Concern   Not on file  Social History Narrative   Not on file   Social Determinants of Health   Financial Resource Strain: Not on file  Food Insecurity: Not on file  Transportation  Needs: Not on file  Physical Activity: Not on file  Stress: Not on file  Social Connections: Not on file  Intimate Partner Violence: Not on file    FAMILY HISTORY: Family History  Problem Relation Age of Onset   Diabetes Mother    Heart failure Mother    Cancer Father        liver   Cancer Paternal Jon Gills        does not know what kind   Cancer Maternal Aunt        breast (half sister) pt mother was adopted   Colon cancer Neg Hx    Thyroid disease Neg Hx     ALLERGIES:  has No Known Allergies.  MEDICATIONS:  Current Outpatient Medications  Medication Sig Dispense Refill   Ascorbic Acid (VITAMIN C ADULT GUMMIES) 125 MG CHEW Chew by mouth.     Calcium-Phosphorus-Vitamin D (CALCIUM/VITAMIN D3/ADULT GUMMY PO) Take by mouth.     ELDERBERRY PO Take 1 capsule by mouth 1 day or 1 dose.     fexofenadine (ALLEGRA) 180 MG tablet Take 180 mg by mouth daily.     folic acid (FOLVITE) 638 MCG tablet Take 1 tablet (400 mcg total) by mouth  daily. 90 tablet 1   mesalamine (LIALDA) 1.2 g EC tablet Take 2 tablets (2.4 g total) by mouth daily with breakfast. 180 tablet 1   No current facility-administered medications for this visit.     PHYSICAL EXAMINATION: ECOG PERFORMANCE STATUS: 0 - Asymptomatic Vitals:   04/06/22 0950  BP: (!) 145/91  Pulse: 66  Resp: 18  Temp: 97.7 F (36.5 C)   Filed Weights   04/06/22 0950  Weight: 163 lb 11.2 oz (74.3 kg)    Physical Exam Constitutional:      General: She is not in acute distress. HENT:     Head: Normocephalic and atraumatic.  Eyes:     General: No scleral icterus.    Pupils: Pupils are equal, round, and reactive to light.  Cardiovascular:     Rate and Rhythm: Normal rate.  Pulmonary:     Effort: Pulmonary effort is normal. No respiratory distress.     Breath sounds: No wheezing.  Abdominal:     General: Bowel sounds are normal. There is no distension.     Palpations: Abdomen is soft.  Musculoskeletal:        General: No  deformity. Normal range of motion.     Cervical back: Normal range of motion and neck supple.  Skin:    General: Skin is warm and dry.  Neurological:     Mental Status: She is alert. Mental status is at baseline.     Cranial Nerves: No cranial nerve deficit.  Psychiatric:        Mood and Affect: Mood normal.      LABORATORY DATA:  I have reviewed the data as listed    Latest Ref Rng & Units 04/05/2022    8:34 AM 07/07/2021   10:52 AM 04/05/2021    9:31 AM  CBC  WBC 4.0 - 10.5 K/uL 6.8  7.0  7.5   Hemoglobin 13.0 - 17.0 g/dL 12.7  12.7  11.7   Hematocrit 39.0 - 52.0 % 42.0  42.9  38.5   Platelets 150 - 400 K/uL 225  228  209       Latest Ref Rng & Units 04/05/2022    8:34 AM 04/27/2021   10:21 AM 10/14/2020    2:15 PM  CMP  Glucose 70 - 99 mg/dL 110  80  86   BUN 6 - 20 mg/dL 14  12  12    Creatinine 0.61 - 1.24 mg/dL 0.78  0.85  0.86   Sodium 135 - 145 mmol/L 138  143  142   Potassium 3.5 - 5.1 mmol/L 3.6  4.1  4.6   Chloride 98 - 111 mmol/L 106  105  104   CO2 22 - 32 mmol/L 25  23  24    Calcium 8.9 - 10.3 mg/dL 9.0  9.5  9.5   Total Protein 6.5 - 8.1 g/dL 7.7  7.0  7.0   Total Bilirubin 0.3 - 1.2 mg/dL 0.4  0.2  <0.2   Alkaline Phos 38 - 126 U/L 48  49  50   AST 15 - 41 U/L 21  15  13    ALT 0 - 44 U/L 16  11  7     Lab Results  Component Value Date   IRON 59 04/05/2022   TIBC 274 04/05/2022   FERRITIN 319 04/05/2022

## 2022-04-06 NOTE — Assessment & Plan Note (Signed)
currently on mesalamine.  Follows up with gastroenterology.

## 2022-04-06 NOTE — Progress Notes (Signed)
Pt here for follow up. No new concerns voiced.   

## 2022-04-06 NOTE — Assessment & Plan Note (Signed)
Microcytic anemia, secondary to alpha thalassemia minor. Labs reviewed and discussed with patient and his mother. . Stable hemoglobin, close to his baseline. Continue folic acid supplementation.  Iron panel is normal.  No signs of iron deficiency.  We discussed about option of him follow-up with primary care provider and being discharged from our clinic.  Patient and mother prefer to continue with annual evaluation at hematology clinic.

## 2022-04-12 ENCOUNTER — Other Ambulatory Visit: Payer: Self-pay

## 2022-04-12 MED ORDER — MESALAMINE 1.2 G PO TBEC: 2.4000 g | DELAYED_RELEASE_TABLET | Freq: Every day | ORAL | 1 refills | Status: DC

## 2022-04-12 NOTE — Telephone Encounter (Signed)
Last office visit 08/18/2021 Ulcerative Pancolitis  02/21/2022 canceled appointment  03/08/22 canceled appointment  04/04/2022 canceled appointment  Has appointment schedule for 07/04/22  Last refill 04/27/2021 180 tab 1 refills

## 2022-07-04 ENCOUNTER — Encounter: Payer: Self-pay | Admitting: Gastroenterology

## 2022-07-04 ENCOUNTER — Ambulatory Visit (INDEPENDENT_AMBULATORY_CARE_PROVIDER_SITE_OTHER): Payer: 59 | Admitting: Gastroenterology

## 2022-07-04 VITALS — BP 146/84 | HR 66 | Temp 98.2°F | Ht 71.0 in | Wt 163.5 lb

## 2022-07-04 DIAGNOSIS — K5100 Ulcerative (chronic) pancolitis without complications: Secondary | ICD-10-CM | POA: Diagnosis not present

## 2022-07-04 NOTE — Progress Notes (Signed)
Cephas Darby, MD [address]  [place]  [place], [place] [address]  Main: [phone]  Fax: [phone]    Gastroenterology Consultation  Referring Provider:     Elba Barman, MD Primary Care Physician:  Elba Barman, MD Primary Gastroenterologist:  Dr. Sherri Sear Reason for Consultation:     Ulcerative colitis        HPI:   Cameron Payne is a 47 y.o. adult referred by Dr. Elba Barman, MD  for consultation & management of ulcerative colitis, currently maintained on Lialda 2.4 g daily.  Patient denies any GI symptoms today.  Patient is accompanied by his mother  IBD diagnosis: Diagnosed with ulcerative pancolitis based on the index colonoscopy on 11/22/2017  Disease course: Clinical presentation at the time of diagnosis include rectal bleeding, loose stools mixed with blood, weight loss in May 2019, underwent upper endoscopy as well as colonoscopy.  EGD was normal.  Colonoscopy revealed mild erythema in the cecum and rectum, pathology revealed mild active colitis in the cecum, moderate active colitis in the rectum with crypt abscess.  Patient was started on Canasa suppository as well as oral mesalamine since diagnosis.  Fecal calprotectin levels at the time of diagnosis were 126, peaked at 483 in 6/21.  Follow-up colonoscopy revealed mild chronic active colitis in the entire colon.  Patient has been maintained on Lialda 2.4 g daily.  Subsequently, fecal calprotectin levels have normalized in 10/21, levels increased to 135 in 03/2021 followed by being normal in 3/23.  Colonoscopy in 2/22 revealed quiescent colitis  Extra intestinal manifestations: None  IBD surgical history: None  Imaging:  MRE none CTE none SBFT none  Procedures: Colonoscopy 11/22/2017 - The examination was suspicious for ulcerative proctitis - The examined portion of the ileum was normal. Biopsied. - Erythematous mucosa at the appendiceal orifice. Biopsied. - Patchy mild mucosal  changes were found in the rectum, rule out ulcerative colitis. Biopsied. - The sigmoid colon, descending colon, transverse colon, ascending colon and cecum are normal. Biopsied. - The distal rectum and anal verge are normal on retroflexion view.  DIAGNOSIS:   B.  TERMINAL ILEUM; COLD BIOPSY:  - SMALL BOWEL MUCOSA WITH INTACT VILLOUS ARCHITECTURE.  - NEGATIVE FOR INTRAEPITHELIAL LYMPHOCYTOSIS, DYSPLASIA AND MALIGNANCY.   C.  COLON, APPENDICEAL ORIFICE; COLD BIOPSY:  - MILD ACTIVE COLITIS.  - NEGATIVE FOR DYSPLASIA AND MALIGNANCY.   D.  COLON, CECUM AND ASCENDING; COLD BIOPSY:  - MUCOSAL EDEMA AND PROMINENT LYMPHOID AGGREGATES.  - NEGATIVE FOR DYSPLASIA AND MALIGNANCY.   E.  TRANSVERSE COLON; COLD BIOPSY:  - MUCOSAL HEMORRHAGE AND PROMINENT LYMPHOID AGGREGATES.  - NEGATIVE FOR DYSPLASIA AND MALIGNANCY.   F.  COLON, DESCENDING AND SIGMOID; COLD BIOPSY:  - MUCOSAL HEMORRHAGE.  - NEGATIVE FOR DYSPLASIA AND MALIGNANCY.   G.  RECTUM; COLD BIOPSY:  - MODERATE ACTIVE COLITIS/PROCTITIS WITH ARCHITECTURAL FEATURES OF  CHRONICITY.  - NEGATIVE FOR DYSPLASIA AND MALIGNANCY.   Colonoscopy 01/10/2019 - Erythematous mucosa in the cecum. Biopsied. - Erythematous mucosa in the transverse colon and in the ascending colon. Biopsied. - Erythematous mucosa in the sigmoid colon. Biopsied. - Erythematous mucosa in the rectum. Biopsied. - The distal rectum and anal verge are normal on retroflexion view. - Biopsies were obtained in the descending colon, in the transverse colon and in the ascending colon. - The areas of erythema were in the periappendiceal region of the cecum, very patchy erythema in the ascending, transverse and sigmoid colon. Mild pathcy erythema  in the rectum. Rest of the colon was normal. DIAGNOSIS:  A.  CECUM ERYTHEMA; COLD BIOPSY:  - MILD TO MODERATE CHRONIC ACTIVE COLITIS IN 2 OF 3 FRAGMENTS.   B.  ASCENDING, TRANSVERSE, AND DESCENDING COLON; COLD BIOPSY:  - MILD CHRONIC  ACTIVE COLITIS INVOLVING 1 OF 10 FRAGMENTS.   C.  SIGMOID COLON ERYTHEMA; COLD BIOPSY:  - MILD CHRONIC ACTIVE COLITIS IN 2 OF 6 FRAGMENTS.   D.  RECTUM ERYTHEMA; COLD BIOPSY:  - MILD CHRONIC ACTIVE PROCTITIS IN 2 OF 2 FRAGMENTS.   Colonoscopy 07/09/2020 - One 4 mm polyp in the descending colon, removed with a cold biopsy forceps. Resected and retrieved. - The examination was otherwise normal. - The rectum, sigmoid colon, descending colon, transverse colon, ascending colon, cecum and terminal ileum are normal. Biopsied. - The distal rectum and anal verge are normal on retroflexion view. DIAGNOSIS:  A.  TERMINAL ILEUM; COLD BIOPSY:  - ILEAL MUCOSA WITH INTACT VILLI.  - NEGATIVE FOR ACTIVE INFLAMMATION, INTRAEPITHELIAL LYMPHOCYTOSIS, AND  INFECTIOUS AGENTS.   B.  CECUM; COLD BIOPSY:  - COLONIC MUCOSA WITH INTACT CRYPT ARCHITECTURE.  - NO EVIDENCE OF COLITIS   C.  ASCENDING COLON; COLD BIOPSY:  - COLONIC MUCOSA WITH INTACT CRYPT ARCHITECTURE.  - NO EVIDENCE OF COLITIS.   D.  TRANSVERSE COLON; COLD BIOPSY:  - ONE OF 2 FRAGMENTS WITH FEATURES OF CRYPT DROPOUT.  - NO EVIDENCE OF ACTIVE INFLAMMATION   E.  DESCENDING COLON; COLD BIOPSY:  - TWO FRAGMENTS WITH FEATURES OF CRYPT DROPOUT.  - NO EVIDENCE OF ACTIVE INFLAMMATION.   F.  COLON POLYP, DESCENDING; COLD BIOPSY:  - TUBULAR ADENOMA.  - NEGATIVE FOR HIGH-GRADE DYSPLASIA AND MALIGNANCY.   G.  SIGMOID COLON; COLD BIOPSY:  - MILDLY ALTERED CRYPT ARCHITECTURE AND MILD INFILTRATES OF PLASMA CELLS  AND EOSINOPHILS EXTENDING TO THE CRYPT BASES, SEE COMMENT.  - NO EVIDENCE OF ACTIVE INFLAMMATION.   H.  RECTUM; COLD BIOPSY:  - MILDLY ALTERED CRYPT ARCHITECTURE WITHOUT SIGNIFICANT CHRONIC  INFLAMMATION.  - NO EVIDENCE OF ACTIVE INFLAMMATION.   Upper Endoscopy 11/22/2017 - White nummular lesions in esophageal mucosa. Brushings performed. - Normal stomach. - Normal duodenal bulb, second portion of the duodenum and examined duodenum.  Biopsied. A.  DUODENUM, SECOND PORTION END BULB; COLD BIOPSY:  - DUODENAL MUCOSA WITH PRESERVED VILLOUS ARCHITECTURE AND FOCAL  BRUNNER'S GLAND HYPERPLASIA.  - NEGATIVE FOR INTRA-EPITHELIAL LYMPHOCYTOSIS, DYSPLASIA AND MALIGNANCY.   VCE none  IBD medications:  Steroids: None  5-ASA: Lialda 2.4 g daily since 10/2017, Canasa suppository at bedtime since 10/2017, at the time of diagnosis, stopped in 3/23 Immunomodulators: AZA, methotrexate: None TPMT status unknown Biologics: Nave Anti TNFs: Anti Integrins: Ustekinumab: Tofactinib: Clinical trial:  NSAIDs: None  Antiplts/Anticoagulants/Anti thrombotics: None  Past Medical History:  Diagnosis Date   Developmental disability    Hypertension    Seizures ([place])    as a child    Past Surgical History:  Procedure Laterality Date   COLONOSCOPY WITH PROPOFOL N/A 11/22/2017   Procedure: COLONOSCOPY WITH PROPOFOL;  Surgeon: Virgel Manifold, MD;  Location: [facility] ENDOSCOPY;  Service: Endoscopy;  Laterality: N/A;   COLONOSCOPY WITH PROPOFOL N/A 01/10/2019   Procedure: COLONOSCOPY WITH PROPOFOL;  Surgeon: Virgel Manifold, MD;  Location: [facility] ENDOSCOPY;  Service: Endoscopy;  Laterality: N/A;   COLONOSCOPY WITH PROPOFOL N/A 07/09/2020   Procedure: COLONOSCOPY WITH PROPOFOL;  Surgeon: Virgel Manifold, MD;  Location: [facility] ENDOSCOPY;  Service: Endoscopy;  Laterality: N/A;   ESOPHAGOGASTRODUODENOSCOPY (EGD) WITH  PROPOFOL N/A 11/22/2017   Procedure: ESOPHAGOGASTRODUODENOSCOPY (EGD) WITH PROPOFOL;  Surgeon: Virgel Manifold, MD;  Location: [facility] ENDOSCOPY;  Service: Endoscopy;  Laterality: N/A;   Current Outpatient Medications:    fexofenadine (ALLEGRA) 180 MG tablet, Take 180 mg by mouth daily., Disp: , Rfl:    folic acid (FOLVITE) 389 MCG tablet, Take 1 tablet (400 mcg total) by mouth daily., Disp: 90 tablet, Rfl: 1   mesalamine (LIALDA) 1.2 g EC tablet, Take 2 tablets (2.4 g total) by mouth daily with breakfast., Disp: 180 tablet,  Rfl: 1   Ascorbic Acid (VITAMIN C ADULT GUMMIES) 125 MG CHEW, Chew by mouth. (Patient not taking: Reported on 07/04/2022), Disp: , Rfl:    Calcium-Phosphorus-Vitamin D (CALCIUM/VITAMIN D3/ADULT GUMMY PO), Take by mouth. (Patient not taking: Reported on 07/04/2022), Disp: , Rfl:    ELDERBERRY PO, Take 1 capsule by mouth 1 day or 1 dose. (Patient not taking: Reported on 07/04/2022), Disp: , Rfl:   Family History  Problem Relation Age of Onset   Diabetes Mother    Heart failure Mother    Cancer Father        liver   Cancer Paternal Jon Gills        does not know what kind   Cancer Maternal Aunt        breast (half sister) pt mother was adopted   Colon cancer Neg Hx    Thyroid disease Neg Hx      Social History   Tobacco Use   Smoking status: Former    Types: Cigars    Quit date: 06/11/2018    Years since quitting: 4.0   Smokeless tobacco: Never   Tobacco comments:    occ.   Vaping Use   Vaping Use: Never used  Substance Use Topics   Alcohol use: Yes    Alcohol/week: 2.0 standard drinks of alcohol    Types: 2 Cans of beer per week    Comment: occ.   Drug use: Never    Allergies as of 07/04/2022   (No Known Allergies)    Review of Systems:    All systems reviewed and negative except where noted in HPI.   Physical Exam:  BP (!) 146/84 (BP Location: Left Arm, Patient Position: Sitting, Cuff Size: Normal)   Pulse 66   Temp 98.2 F (36.8 C) (Oral)   Ht '5\' 11"'$  (1.803 m)   Wt 163 lb 8 oz (74.2 kg)   BMI 22.80 kg/m  No LMP recorded.  General:   Alert,  Well-developed, well-nourished, pleasant and cooperative in NAD Head:  Normocephalic and atraumatic. Eyes:  Sclera clear, no icterus.   Conjunctiva pink. Ears:  Normal auditory acuity. Nose:  No deformity, discharge, or lesions. Mouth:  No deformity or lesions,oropharynx pink & moist. Neck:  Supple; no masses or thyromegaly. Lungs:  Respirations even and unlabored.  Clear throughout to auscultation.   No wheezes,  crackles, or rhonchi. No acute distress. Heart:  Regular rate and rhythm; no murmurs, clicks, rubs, or gallops. Abdomen:  Normal bowel sounds. Soft, non-tender and non-distended without masses, hepatosplenomegaly or hernias noted.  No guarding or rebound tenderness.   Rectal: Not performed Msk:  Symmetrical without gross deformities. Good, equal movement & strength bilaterally. Pulses:  Normal pulses noted. Extremities:  No clubbing or edema.  No cyanosis. Neurologic:  Alert and oriented x3;  grossly normal neurologically. Skin:  Intact without significant lesions or rashes. No jaundice. Psych:  Alert and cooperative. Normal mood and affect.  Assessment  and Plan:   Cameron Payne is a 47 y.o. pleasant African-American male with ulcerative pancolitis diagnosed based on the colonoscopy in 12/2018, currently maintained on Lialda 2.4 g daily in clinical and histologic remission  Ulcerative pancolitis: Quiescent based on the colonoscopy in 06/2020 Repeat fecal calprotectin levels Continue Lialda 2.4 g daily  Microcytic anemia Patient is evaluated by hematology, diagnosed with alpha thalassemia trait No evidence of iron deficiency  Follow up annually or sooner if needed   Cephas Darby, MD

## 2022-07-09 LAB — CALPROTECTIN, FECAL: Calprotectin, Fecal: 572 ug/g — ABNORMAL HIGH (ref 0–120)

## 2022-07-11 ENCOUNTER — Other Ambulatory Visit: Payer: Self-pay

## 2022-07-11 ENCOUNTER — Emergency Department
Admission: EM | Admit: 2022-07-11 | Discharge: 2022-07-11 | Disposition: A | Discharge status: Home / Self Care | Payer: 59 | Attending: Emergency Medicine | Admitting: Emergency Medicine

## 2022-07-11 ENCOUNTER — Telehealth: Payer: Self-pay

## 2022-07-11 ENCOUNTER — Emergency Department: Payer: 59

## 2022-07-11 ENCOUNTER — Encounter: Payer: Self-pay | Admitting: Emergency Medicine

## 2022-07-11 DIAGNOSIS — X58XXXA Exposure to other specified factors, initial encounter: Secondary | ICD-10-CM | POA: Diagnosis not present

## 2022-07-11 DIAGNOSIS — R195 Other fecal abnormalities: Secondary | ICD-10-CM

## 2022-07-11 DIAGNOSIS — T18128A Food in esophagus causing other injury, initial encounter: Secondary | ICD-10-CM | POA: Insufficient documentation

## 2022-07-11 DIAGNOSIS — I10 Essential (primary) hypertension: Secondary | ICD-10-CM | POA: Insufficient documentation

## 2022-07-11 DIAGNOSIS — D649 Anemia, unspecified: Secondary | ICD-10-CM | POA: Insufficient documentation

## 2022-07-11 DIAGNOSIS — T18108A Unspecified foreign body in esophagus causing other injury, initial encounter: Secondary | ICD-10-CM

## 2022-07-11 LAB — BASIC METABOLIC PANEL
Anion gap: 9 (ref 5–15)
BUN: 13 mg/dL (ref 6–20)
CO2: 27 mmol/L (ref 22–32)
Calcium: 9 mg/dL (ref 8.9–10.3)
Chloride: 104 mmol/L (ref 98–111)
Creatinine, Ser: 0.92 mg/dL (ref 0.61–1.24)
GFR, Estimated: >60 mL/min (ref >60)
Glucose, Bld: 105 mg/dL — ABNORMAL HIGH (ref 70–99)
Potassium: 3.5 mmol/L (ref 3.5–5.1)
Sodium: 140 mmol/L (ref 135–145)

## 2022-07-11 LAB — CBC WITH DIFFERENTIAL/PLATELET
Abs Immature Granulocytes: 0.02 10*3/uL (ref 0.00–0.07)
Basophils Absolute: 0 10*3/uL (ref 0.0–0.1)
Basophils Relative: 0 %
Eosinophils Absolute: 0 10*3/uL (ref 0.0–0.5)
Eosinophils Relative: 0 %
HCT: 41.9 % (ref 39.0–52.0)
Hemoglobin: 12.4 g/dL — ABNORMAL LOW (ref 13.0–17.0)
Immature Granulocytes: 0 %
Lymphocytes Relative: 20 %
Lymphs Abs: 1.5 10*3/uL (ref 0.7–4.0)
MCH: 22 pg — ABNORMAL LOW (ref 26.0–34.0)
MCHC: 29.6 g/dL — ABNORMAL LOW (ref 30.0–36.0)
MCV: 74.4 fL — ABNORMAL LOW (ref 80.0–100.0)
Monocytes Absolute: 0.4 10*3/uL (ref 0.1–1.0)
Monocytes Relative: 6 %
Neutro Abs: 5.5 10*3/uL (ref 1.7–7.7)
Neutrophils Relative %: 74 %
Platelets: 228 10*3/uL (ref 150–400)
RBC: 5.63 MIL/uL (ref 4.22–5.81)
RDW: 13.3 % (ref 11.5–15.5)
WBC: 7.5 10*3/uL (ref 4.0–10.5)
nRBC: 0 % (ref 0.0–0.2)

## 2022-07-11 MED ORDER — MESALAMINE 1.2 G PO TBEC: 2.4000 g | DELAYED_RELEASE_TABLET | Freq: Two times a day (BID) | ORAL | 2 refills | Status: DC

## 2022-07-11 MED ORDER — ONDANSETRON HCL 4 MG/2ML IJ SOLN
4.0000 mg | Freq: Once | INTRAMUSCULAR | Status: AC
  Administered 2022-07-11: 4 mg via INTRAVENOUS
  Filled 2022-07-11: qty 2

## 2022-07-11 MED ORDER — GLUCAGON HCL RDNA (DIAGNOSTIC) 1 MG IJ SOLR
1.0000 mg | Freq: Once | INTRAMUSCULAR | Status: AC
  Administered 2022-07-11: 1 mg via INTRAVENOUS
  Filled 2022-07-11: qty 1

## 2022-07-11 MED ORDER — SODIUM CHLORIDE 0.9 % IV BOLUS
500.0000 mL | Freq: Once | INTRAVENOUS | Status: AC
  Administered 2022-07-11: 500 mL via INTRAVENOUS

## 2022-07-11 NOTE — Telephone Encounter (Signed)
Called and left a message for call back. Order stool test. Sent medication to the pharmacy for increase in medication

## 2022-07-11 NOTE — Telephone Encounter (Signed)
Called and left a message for call back  

## 2022-07-11 NOTE — Telephone Encounter (Signed)
-----   Message from Lin Landsman, MD sent at 07/09/2022  4:17 PM EST ----- Please inform patient's mom that his fecal calprotectin levels came back surprisingly elevated.  This suggests that his colitis could be active.  Recommend GI profile PCR to rule out infection.  If this is negative, recommend colonoscopy because his last colonoscopy was 2 years ago. In the meantime, increase Lialda to 2 tablets twice daily  Rohini Vanga

## 2022-07-11 NOTE — ED Notes (Signed)
Patient now complaining of headache/dizziness after medication. Cameron Payne, [place] aware. Patient given warm cola and fluids per [place], [place].

## 2022-07-11 NOTE — Discharge Instructions (Addendum)
-  Your labs and your chest x-ray are reassuring.  I suspect that this food bolus will dissolve and pass on its own.  You may continue to drink fluids/soda, as well as soft foods such as applesauce, yogurt, soups, etc.  Please be sure to chew your food thoroughly prior to swallowing.  -If your symptoms fail to improve after 1 to 2 days, please follow-up with your gastroenterologist as discussed.  -Return to the emergency department anytime if you begin to experience any new or worsening symptoms.

## 2022-07-11 NOTE — ED Triage Notes (Signed)
Arrives stating he was eating a potato yesterday and it got stuck in his throat.  States he ate the potato yesterday afternoon around 1700.  Managing oral secretions.  Voice clear and strong.  States pain to throat when swallowing.  AAOx3.  Skin warm and dry. NAD

## 2022-07-11 NOTE — ED Provider Notes (Signed)
[facility] Provider Note    Event Date/Time   First MD Initiated Contact with Patient 07/11/22 1121     (approximate)   History   Chief Complaint No chief complaint on file.   HPI Cameron Payne is a 47 y.o. adult, history of hypertension, ulcerative colitis, thalassemia minor, developmental disability, presents to the emergency department for suspected food bolus.  Patient states that he was eating/potatoes yesterday and he feels like he ate too fast and got potato stuck in his throat.  He has still been drinking since then, however has not been wanting to eat.  Reports intermittent episodes of nausea and "dry heaving" denies any active pain at this time.  This has not happened to him before.  Denies fever/chills, chest pain, shortness of breath, abdominal pain, nausea/vomiting, urinary symptoms, rashes, or dizziness/lightheadedness.  History Limitations: Developmental disability.  He is joined by his mother.        Physical Exam  Triage Vital Signs: ED Triage Vitals  Enc Vitals Group     BP 07/11/22 1129 (!) 142/96     Pulse Rate 07/11/22 1129 82     Resp 07/11/22 1129 17     Temp 07/11/22 1129 98.1 F (36.7 C)     Temp Source 07/11/22 1129 Oral     SpO2 07/11/22 1129 100 %     Weight 07/11/22 1107 163 lb 2.3 oz (74 kg)     Height 07/11/22 1107 5' 11"$  (1.803 m)     Head Circumference --      Peak Flow --      Pain Score 07/11/22 1107 0     Pain Loc --      Pain Edu? --      Excl. in [place]? --     Most recent vital signs: Vitals:   07/11/22 1129 07/11/22 1203  BP: (!) 142/96 (!) 139/102  Pulse: 82 83  Resp: 17 16  Temp: 98.1 F (36.7 C)   SpO2: 100% 100%    General: Awake, NAD.  Skin: Warm, dry. No rashes or lesions.  Eyes: PERRL. Conjunctivae normal.  CV: Good peripheral perfusion.  Resp: Normal effort.  Abd: Soft, non-tender. No distention.  Neuro: At baseline. No gross neurological deficits.  Musculoskeletal: Normal ROM of  all extremities.  Focused Exam: Throat exam unremarkable.   Physical Exam    ED Results / Procedures / Treatments  Labs (all labs ordered are listed, but only abnormal results are displayed) Labs Reviewed  CBC WITH DIFFERENTIAL/PLATELET - Abnormal; Notable for the following components:      Result Value   Hemoglobin 12.4 (*)    MCV 74.4 (*)    MCH 22.0 (*)    MCHC 29.6 (*)    All other components within normal limits  BASIC METABOLIC PANEL - Abnormal; Notable for the following components:   Glucose, Bld 105 (*)    All other components within normal limits     EKG N/A.    RADIOLOGY  ED Provider Interpretation: I personally viewed and interpreted this radiograph, no evidence of acute cardiopulmonary abnormalities or foreign bodies.  DG Chest 2 View  Result Date: 07/11/2022 CLINICAL DATA:  Food stuck in throat EXAM: CHEST - 2 VIEW COMPARISON:  None Available. FINDINGS: Lungs are clear.  No pleural effusion or pneumothorax. The heart is normal in size.  No evidence of pneumomediastinum. Visualized osseous structures are within normal limits. IMPRESSION: Normal chest radiographs. Electronically Signed   By: Bertis Ruddy  [place] Pink M.D.   On: 07/11/2022 13:22    PROCEDURES:  Critical Care performed: N/A.  Procedures    MEDICATIONS ORDERED IN ED: Medications  glucagon (human recombinant) (GLUCAGEN) injection 1 mg (1 mg Intravenous Given 07/11/22 1202)  ondansetron (ZOFRAN) injection 4 mg (4 mg Intravenous Given 07/11/22 1202)  sodium chloride 0.9 % bolus 500 mL (500 mLs Intravenous New Bag/Given 07/11/22 1214)     IMPRESSION / MDM / ASSESSMENT AND PLAN / ED COURSE  I reviewed the triage vital signs and the nursing notes.                              Differential diagnosis includes, but is not limited to, dysphagia, esophageal foreign body, Boerhaave's, esophagitis  ED Course Patient appears well, vitals within normal limits.  NAD.  Given history, will initiate  ondansetron, glucagon, and IV fluids.  CBC shows no leukocytosis.  Mild anemia present with hemoglobin of 12.4.  BMP shows no significant abnormalities.  Assessment/Plan Patient presents with dysphagia following suspected esophageal food impaction started yesterday after eating potatoes.  He appears well clinically.  He is still tolerating p.o. and managing secretions well.  No abnormalities noted on physical exam.  Labs are reassuring.  X-ray does not show any evidence of esophageal perforation.  Treated here with glucagon, ondansetron, and IV fluids.  Patient reports some improvement, though not complete resolution.  Advised him to continue to drink fluids over the next couple days and consume soft pured foods.  If symptoms fail to improve after 1 to 2 days, advised him to follow-up with his gastroenterologist.  Both him and his mother were amenable to this plan.  Will discharge.  Considered admission for this patient, but given his stable presentation and unremarkable findings, he is unlikely benefit from admission.  Provided the patient with anticipatory guidance, return precautions, and educational material. Encouraged the patient to return to the emergency department at any time if they begin to experience any new or worsening symptoms. Patient expressed understanding and agreed with the plan.   Patient's presentation is most consistent with acute complicated illness / injury requiring diagnostic workup.       FINAL CLINICAL IMPRESSION(S) / ED DIAGNOSES   Final diagnoses:  Esophageal foreign body, initial encounter     Rx / DC Orders   ED Discharge Orders     None        Note:  This document was prepared using Dragon voice recognition software and may include unintentional dictation errors.   Teodoro Spray, [place] 07/11/22 1341    Duffy Bruce, MD 07/11/22 Lurena Nida

## 2022-07-12 NOTE — Telephone Encounter (Signed)
Unable to reach patient do you want me to send a letter

## 2022-07-12 NOTE — Telephone Encounter (Signed)
Called and left a message for call back  

## 2022-07-12 NOTE — Telephone Encounter (Signed)
Mailed letter to patient

## 2022-07-12 NOTE — Telephone Encounter (Signed)
Please do, thank you  RV

## 2022-07-18 NOTE — Telephone Encounter (Signed)
Patient mom called back and she verbalized understanding of results. She will come and pick up a stool kit for him and pick up the increased medication

## 2022-07-23 LAB — GI PROFILE, STOOL, PCR

## 2022-07-25 ENCOUNTER — Telehealth: Payer: Self-pay

## 2022-07-25 DIAGNOSIS — R1319 Other dysphagia: Secondary | ICD-10-CM

## 2022-07-25 NOTE — Telephone Encounter (Signed)
-----   Message from Lin Landsman, MD sent at 07/24/2022  9:41 PM EST ----- His stool studies came back negative for infection.  Recommend colonoscopy to assess his underlying ulcerative colitis because of elevated fecal calprotectin levels  Rohini Vanga

## 2022-07-25 NOTE — Telephone Encounter (Signed)
Called and left a message for call back  

## 2022-07-26 ENCOUNTER — Other Ambulatory Visit: Payer: Self-pay

## 2022-07-26 DIAGNOSIS — K5100 Ulcerative (chronic) pancolitis without complications: Secondary | ICD-10-CM

## 2022-07-26 DIAGNOSIS — R195 Other fecal abnormalities: Secondary | ICD-10-CM

## 2022-07-26 MED ORDER — NA SULFATE-K SULFATE-MG SULF 17.5-3.13-1.6 GM/177ML PO SOLN: 354.0000 mL | Freq: Once | ORAL | 0 refills | Status: AC

## 2022-07-26 NOTE — Telephone Encounter (Signed)
Agree with upper endoscopy along with colonoscopy  Recent food impaction  RV

## 2022-07-26 NOTE — Telephone Encounter (Signed)
Schedule patient for colonoscopy on 08/16/2022 with patient mom. Went over instructions and mailed them to patient. Sent prep to the pharmacy. Mom states that she had to take him to the ER on 07/11/2022 and the doctor said he had food stuck in esophagus. She states they told him to eat softer foods for a week and eat slowly. Mom states he is doing better eating but is wondering if he needs to have a EGD done since a colonoscopy is being done now. Please advise if a EGD can be added to the colonoscopy

## 2022-07-26 NOTE — Telephone Encounter (Signed)
Called and left a message for call back  

## 2022-07-27 NOTE — Telephone Encounter (Signed)
Called Endo and talk to [place] and she will get EGD added

## 2022-08-15 ENCOUNTER — Telehealth: Payer: Self-pay

## 2022-08-15 NOTE — Telephone Encounter (Signed)
Received voice message from Mariann Laster (Pt's mother) requesting to reschedule her son's  Daiva Eves) colonoscopy with Dr. Marius Ditch for 08/16/22 due to instructions not received.  Instructions were prepared and mailed on 02/27 by Dr. Verlin Grills CMA Caryl Pina. Prep sent to [facility] also.  I was unable to contact Mariann Laster to reschedule her son's colonoscopy-no answer.  I will forward message to Caryl Pina to follow up.  Thanks, Sharyn Lull CMA

## 2022-08-15 NOTE — Telephone Encounter (Signed)
Patient mom reschedule to 09/13/2022. Called ENDO and talk to trish and she will get it reschedule. She states that they never received the instructions in the mail. Has prep at home and will keep till the procedure

## 2022-09-12 ENCOUNTER — Encounter: Payer: Self-pay | Admitting: Gastroenterology

## 2022-09-13 ENCOUNTER — Encounter: Payer: Self-pay | Admitting: Gastroenterology

## 2022-09-13 ENCOUNTER — Ambulatory Visit: Payer: 59 | Admitting: Registered Nurse

## 2022-09-13 ENCOUNTER — Encounter
Admission: RE | Disposition: A | Discharge status: Home / Self Care | Payer: Self-pay | Source: Home / Self Care | Attending: Gastroenterology

## 2022-09-13 ENCOUNTER — Ambulatory Visit
Admission: RE | Admit: 2022-09-13 | Discharge: 2022-09-13 | Disposition: A | Discharge status: Home / Self Care | Payer: 59 | Attending: Gastroenterology | Admitting: Gastroenterology

## 2022-09-13 DIAGNOSIS — K5130 Ulcerative (chronic) rectosigmoiditis without complications: Secondary | ICD-10-CM

## 2022-09-13 DIAGNOSIS — Z8711 Personal history of peptic ulcer disease: Secondary | ICD-10-CM | POA: Insufficient documentation

## 2022-09-13 DIAGNOSIS — Z8669 Personal history of other diseases of the nervous system and sense organs: Secondary | ICD-10-CM | POA: Diagnosis not present

## 2022-09-13 DIAGNOSIS — K449 Diaphragmatic hernia without obstruction or gangrene: Secondary | ICD-10-CM | POA: Diagnosis not present

## 2022-09-13 DIAGNOSIS — K5190 Ulcerative colitis, unspecified, without complications: Secondary | ICD-10-CM | POA: Diagnosis not present

## 2022-09-13 DIAGNOSIS — I10 Essential (primary) hypertension: Secondary | ICD-10-CM | POA: Insufficient documentation

## 2022-09-13 DIAGNOSIS — Z09 Encounter for follow-up examination after completed treatment for conditions other than malignant neoplasm: Secondary | ICD-10-CM | POA: Diagnosis present

## 2022-09-13 DIAGNOSIS — R1319 Other dysphagia: Secondary | ICD-10-CM

## 2022-09-13 DIAGNOSIS — R1314 Dysphagia, pharyngoesophageal phase: Secondary | ICD-10-CM | POA: Insufficient documentation

## 2022-09-13 DIAGNOSIS — K5100 Ulcerative (chronic) pancolitis without complications: Secondary | ICD-10-CM

## 2022-09-13 DIAGNOSIS — R195 Other fecal abnormalities: Secondary | ICD-10-CM

## 2022-09-13 DIAGNOSIS — Z87891 Personal history of nicotine dependence: Secondary | ICD-10-CM | POA: Diagnosis not present

## 2022-09-13 HISTORY — PX: ESOPHAGOGASTRODUODENOSCOPY: SHX5428

## 2022-09-13 HISTORY — PX: COLONOSCOPY WITH PROPOFOL: SHX5780

## 2022-09-13 SURGERY — COLONOSCOPY WITH PROPOFOL
Anesthesia: General

## 2022-09-13 MED ORDER — PHENYLEPHRINE HCL (PRESSORS) 10 MG/ML IV SOLN
INTRAVENOUS | Status: DC | PRN
  Administered 2022-09-13: 160 ug via INTRAVENOUS
  Administered 2022-09-13: 120 ug via INTRAVENOUS

## 2022-09-13 MED ORDER — PROPOFOL 10 MG/ML IV BOLUS
INTRAVENOUS | Status: AC
  Filled 2022-09-13: qty 20

## 2022-09-13 MED ORDER — GLYCOPYRROLATE 0.2 MG/ML IJ SOLN
INTRAMUSCULAR | Status: AC
  Filled 2022-09-13: qty 1

## 2022-09-13 MED ORDER — PHENYLEPHRINE 80 MCG/ML (10ML) SYRINGE FOR IV PUSH (FOR BLOOD PRESSURE SUPPORT)
PREFILLED_SYRINGE | INTRAVENOUS | Status: AC
  Filled 2022-09-13: qty 10

## 2022-09-13 MED ORDER — PROPOFOL 1000 MG/100ML IV EMUL
INTRAVENOUS | Status: AC
  Filled 2022-09-13: qty 200

## 2022-09-13 MED ORDER — DEXMEDETOMIDINE HCL IN NACL 80 MCG/20ML IV SOLN
INTRAVENOUS | Status: AC
  Filled 2022-09-13: qty 20

## 2022-09-13 MED ORDER — PROPOFOL 10 MG/ML IV BOLUS
INTRAVENOUS | Status: DC | PRN
  Administered 2022-09-13: 30 mg via INTRAVENOUS
  Administered 2022-09-13: 40 mg via INTRAVENOUS
  Administered 2022-09-13: 80 mg via INTRAVENOUS

## 2022-09-13 MED ORDER — PROPOFOL 500 MG/50ML IV EMUL
INTRAVENOUS | Status: DC | PRN
  Administered 2022-09-13: 200 ug/kg/min via INTRAVENOUS

## 2022-09-13 MED ORDER — SODIUM CHLORIDE 0.9 % IV SOLN: INTRAVENOUS | Status: DC

## 2022-09-13 MED ORDER — PROPOFOL 1000 MG/100ML IV EMUL
INTRAVENOUS | Status: AC
  Filled 2022-09-13: qty 100

## 2022-09-13 MED ORDER — EPHEDRINE 5 MG/ML INJ
INTRAVENOUS | Status: AC
  Filled 2022-09-13: qty 5

## 2022-09-13 MED ORDER — LIDOCAINE HCL (CARDIAC) PF 100 MG/5ML IV SOSY
PREFILLED_SYRINGE | INTRAVENOUS | Status: DC | PRN
  Administered 2022-09-13: 100 mg via INTRAVENOUS

## 2022-09-13 MED ORDER — LIDOCAINE HCL (PF) 2 % IJ SOLN
INTRAMUSCULAR | Status: AC
  Filled 2022-09-13: qty 25

## 2022-09-13 NOTE — Op Note (Signed)
[facility] Patient Name: Cameron Payne Procedure Date: 09/13/2022 7:31 AM MRN: [id number] Account #: [id number] Date of Birth: 10/26/1975 Admit Type: Outpatient Age: 47 Room: [facility] ENDO ROOM 4 Gender: Male Note Status: Finalized Instrument Name: Upper Endoscope [id number] Procedure:             Upper GI endoscopy Indications:           Esophageal dysphagia Providers:             Toney Reil MD, MD Referring MD:          Ezekiel Slocumb, MD (Referring MD) Medicines:             General Anesthesia Complications:         No immediate complications. Estimated blood loss: None. Procedure:             Pre-Anesthesia Assessment:                        - Prior to the procedure, a History and Physical was                         performed, and patient medications and allergies were                         reviewed. The patient is competent. The risks and                         benefits of the procedure and the sedation options and                         risks were discussed with the patient. All questions                         were answered and informed consent was obtained.                         Patient identification and proposed procedure were                         verified by the physician, the nurse, the                         anesthesiologist, the anesthetist and the technician                         in the pre-procedure area in the procedure room in the                         endoscopy suite. Mental Status Examination: alert and                         oriented. Airway Examination: normal oropharyngeal                         airway and neck mobility. Respiratory Examination:                         clear to auscultation. CV Examination: normal.  Prophylactic Antibiotics: The patient does not require                         prophylactic antibiotics. Prior Anticoagulants: The                         patient has  taken no anticoagulant or antiplatelet                         agents. ASA Grade Assessment: II - A patient with mild                         systemic disease. After reviewing the risks and                         benefits, the patient was deemed in satisfactory                         condition to undergo the procedure. The anesthesia                         plan was to use general anesthesia. Immediately prior                         to administration of medications, the patient was                         re-assessed for adequacy to receive sedatives. The                         heart rate, respiratory rate, oxygen saturations,                         blood pressure, adequacy of pulmonary ventilation, and                         response to care were monitored throughout the                         procedure. The physical status of the patient was                         re-assessed after the procedure.                        After obtaining informed consent, the endoscope was                         passed under direct vision. Throughout the procedure,                         the patient's blood pressure, pulse, and oxygen                         saturations were monitored continuously. The Endoscope                         was introduced through the mouth, and advanced to the  second part of duodenum. The upper GI endoscopy was                         accomplished without difficulty. The patient tolerated                         the procedure well. Findings:      The duodenal bulb and second portion of the duodenum were normal.      The entire examined stomach was normal.      Esophagogastric landmarks were identified: the gastroesophageal junction       was found at 41 cm from the incisors.      The gastroesophageal junction and examined esophagus were normal.       Biopsies were taken with a cold forceps for histology.      A small hiatal hernia was  present. Impression:            - Normal duodenal bulb and second portion of the                         duodenum.                        - Normal stomach.                        - Esophagogastric landmarks identified.                        - Normal gastroesophageal junction and esophagus.                         Biopsied.                        - Small hiatal hernia. Recommendation:        - Await pathology results.                        - Proceed with colonoscopy as scheduled                        See colonoscopy report                        - Follow an antireflux regimen.                        - Use Prilosec (omeprazole) 40 mg PO daily. Procedure Code(s):     --- Professional ---                        [phone], Esophagogastroduodenoscopy, flexible,                         transoral; with biopsy, single or multiple Diagnosis Code(s):     --- Professional ---                        R13.14, Dysphagia, pharyngoesophageal phase CPT copyright 2022 American Medical Association. All rights reserved. The codes documented in this report are preliminary and upon coder review may  be revised to meet current compliance requirements. Dr. Libby Maw  Toney Reil MD, MD 09/13/2022 8:31:02 AM This report has been signed electronically. Number of Addenda: 0 Note Initiated On: 09/13/2022 7:31 AM Estimated Blood Loss:  Estimated blood loss: none. Estimated blood loss: none.      [facility]

## 2022-09-13 NOTE — Anesthesia Postprocedure Evaluation (Signed)
Anesthesia Post Note  Patient: Cameron Payne  Procedure(s) Performed: COLONOSCOPY WITH PROPOFOL ESOPHAGOGASTRODUODENOSCOPY (EGD)  Patient location during evaluation: Endoscopy Anesthesia Type: General Level of consciousness: awake and alert Pain management: pain level controlled Vital Signs Assessment: post-procedure vital signs reviewed and stable Respiratory status: spontaneous breathing, nonlabored ventilation, respiratory function stable and patient connected to nasal cannula oxygen Cardiovascular status: blood pressure returned to baseline and stable Postop Assessment: no apparent nausea or vomiting Anesthetic complications: no   No notable events documented.   Last Vitals:  Vitals:   09/13/22 0923 09/13/22 0931  BP: (!) 130/96 132/88  Pulse: 70 62  Resp: 16 11  Temp:    SpO2: 100% 100%    Last Pain:  Vitals:   09/13/22 0931  TempSrc:   PainSc: 0-No pain                 Lenard Simmer

## 2022-09-13 NOTE — Op Note (Signed)
[facility] Patient Name: Cameron Payne Procedure Date: 09/13/2022 7:30 AM MRN: [id number] Account #: [id number] Date of Birth: [birth year] Admit Type: Outpatient Age: 47 Room: [facility] ENDO ROOM 4 Gender: Male Note Status: Finalized Instrument Name: Colonoscope [id number] Procedure:             Colonoscopy Indications:           Last colonoscopy: February 2022, , Follow-up of                         chronic ulcerative pancolitis, elevated fecal                         calprotectin levels Providers:             Toney Reil MD, MD Referring MD:          Ezekiel Slocumb, MD (Referring MD) Medicines:             General Anesthesia Complications:         No immediate complications. Estimated blood loss: None. Procedure:             Pre-Anesthesia Assessment:                        - Prior to the procedure, a History and Physical was                         performed, and patient medications and allergies were                         reviewed. The patient is competent. The risks and                         benefits of the procedure and the sedation options and                         risks were discussed with the patient. All questions                         were answered and informed consent was obtained.                         Patient identification and proposed procedure were                         verified by the physician, the nurse, the                         anesthesiologist, the anesthetist and the technician                         in the pre-procedure area in the procedure room in the                         endoscopy suite. Mental Status Examination: alert and                         oriented. Airway Examination: normal oropharyngeal  airway and neck mobility. Respiratory Examination:                         clear to auscultation. CV Examination: normal.                         Prophylactic Antibiotics: The patient  does not require                         prophylactic antibiotics. Prior Anticoagulants: The                         patient has taken no anticoagulant or antiplatelet                         agents. ASA Grade Assessment: II - A patient with mild                         systemic disease. After reviewing the risks and                         benefits, the patient was deemed in satisfactory                         condition to undergo the procedure. The anesthesia                         plan was to use general anesthesia. Immediately prior                         to administration of medications, the patient was                         re-assessed for adequacy to receive sedatives. The                         heart rate, respiratory rate, oxygen saturations,                         blood pressure, adequacy of pulmonary ventilation, and                         response to care were monitored throughout the                         procedure. The physical status of the patient was                         re-assessed after the procedure.                        After obtaining informed consent, the colonoscope was                         passed under direct vision. Throughout the procedure,                         the patient's blood pressure, pulse, and oxygen  saturations were monitored continuously. The                         Colonoscope was introduced through the anus and                         advanced to the the terminal ileum, with                         identification of the appendiceal orifice and IC                         valve. The colonoscopy was performed without                         difficulty. The patient tolerated the procedure well.                         The quality of the bowel preparation was evaluated                         using the BBPS [facility] Bowel Preparation Scale) with                         scores of: Right Colon = 3, Transverse Colon  = 3 and                         Left Colon = 3 (entire mucosa seen well with no                         residual staining, small fragments of stool or opaque                         liquid). The total BBPS score equals 9. The terminal                         ileum, ileocecal valve, appendiceal orifice, and                         rectum were photographed. Findings:      The perianal and digital rectal examinations were normal. Pertinent       negatives include normal sphincter tone and no palpable rectal lesions.      The terminal ileum appeared normal.      Inflammation was found in a continuous and circumferential pattern from       the anus to the rectosigmoid colon upto 20cm. This was graded as Mayo       Score 1 (mild, with erythema, decreased vascular pattern, mild       friability), and when compared to the previous examination, the findings       are worsened. Biopsies were taken with a cold forceps for histology.      Inflammation was not found based on the endoscopic appearance of the       mucosa in the colon from cecum to 20cm from anal verge. This was graded       as Mayo Score 0 (normal or inactive disease), and when compared to the       previous  examination, the findings are unchanged. Biopsies were taken       with a cold forceps for histology. Impression:            - The examined portion of the ileum was normal.                        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 Biopsied.                        - Inactive (Mayo Score 0) ulcerative colitis,                         unchanged since the last examination. Biopsied. Recommendation:        - Discharge patient to home (with escort).                        - Resume previous diet today.                        - Continue present medications.                        - Await pathology results.                        - Return to my office at  appointment to be scheduled. Procedure Code(s):     --- Professional ---                        [phone], Colonoscopy, flexible; with biopsy, single or                         multiple Diagnosis Code(s):     --- Professional ---                        K51.30, Ulcerative (chronic) rectosigmoiditis without                         complications                        K51.00, Ulcerative (chronic) pancolitis without                         complications CPT copyright 2022 American Medical Association. All rights reserved. The codes documented in this report are preliminary and upon coder review may  be revised to meet current compliance requirements. Dr. Libby Maw Toney Reil MD, MD 09/13/2022 9:03:26 AM This report has been signed electronically. Number of Addenda: 0 Note Initiated On: 09/13/2022 7:30 AM Scope Withdrawal Time: 0 hours 19 minutes 12 seconds  Total Procedure Duration: 0 hours 25 minutes 23 seconds  Estimated Blood Loss:  Estimated blood loss: none.      [facility]

## 2022-09-13 NOTE — H&P (Signed)
Cameron Repress, MD [address]  [address]  [place], [place] [address]  Main: [phone]  Fax: [phone] Pager: [phone]  Primary Care Physician:  Cameron Fuchs, MD Primary Gastroenterologist:  Dr. Arlyss Payne  Pre-Procedure History & Physical: HPI:  Cameron Payne is a 47 y.o. adult is here for an endoscopy and colonoscopy.   Past Medical History:  Diagnosis Date   Developmental disability    Hypertension    Mild chronic ulcerative colitis without complication    Seizures    as a child   Syncope and collapse 07/09/2013    Past Surgical History:  Procedure Laterality Date   COLONOSCOPY WITH PROPOFOL N/A 11/22/2017   Procedure: COLONOSCOPY WITH PROPOFOL;  Surgeon: Cameron Spillers, MD;  Location: [facility] ENDOSCOPY;  Service: Endoscopy;  Laterality: N/A;   COLONOSCOPY WITH PROPOFOL N/A 01/10/2019   Procedure: COLONOSCOPY WITH PROPOFOL;  Surgeon: Cameron Spillers, MD;  Location: [facility] ENDOSCOPY;  Service: Endoscopy;  Laterality: N/A;   COLONOSCOPY WITH PROPOFOL N/A 07/09/2020   Procedure: COLONOSCOPY WITH PROPOFOL;  Surgeon: Cameron Spillers, MD;  Location: [facility] ENDOSCOPY;  Service: Endoscopy;  Laterality: N/A;   ESOPHAGOGASTRODUODENOSCOPY (EGD) WITH PROPOFOL N/A 11/22/2017   Procedure: ESOPHAGOGASTRODUODENOSCOPY (EGD) WITH PROPOFOL;  Surgeon: Cameron Spillers, MD;  Location: [facility] ENDOSCOPY;  Service: Endoscopy;  Laterality: N/A;   TONSILLECTOMY      Prior to Admission medications   Medication Sig Start Date End Date Taking? Authorizing Provider  fexofenadine (ALLEGRA) 180 MG tablet Take 180 mg by mouth daily.   Yes [provider]  folic acid (FOLVITE) 400 MCG tablet Take 1 tablet (400 mcg total) by mouth daily. 03/12/18  Yes Cameron Patience, MD  mesalamine (LIALDA) 1.2 g EC tablet Take 2 tablets (2.4 g total) by mouth 2 (two) times daily. 07/11/22  Yes Cameron Payne, Loel Dubonnet, MD  Ascorbic Acid (VITAMIN C ADULT GUMMIES) 125 MG CHEW Chew by  mouth. Patient not taking: Reported on 07/04/2022    [provider]  Calcium-Phosphorus-Vitamin D (CALCIUM/VITAMIN D3/ADULT GUMMY PO) Take by mouth. Patient not taking: Reported on 07/04/2022    [provider]  ELDERBERRY PO Take 1 capsule by mouth 1 day or 1 dose. Patient not taking: Reported on 07/04/2022    [provider]    Allergies as of 07/26/2022   (No Known Allergies)    Family History  Problem Relation Age of Onset   Diabetes Mother    Heart failure Mother    Cancer Father        liver   Cancer Paternal [name]        does not know what kind   Cancer Maternal Aunt        breast (half sister) pt mother was adopted   Colon cancer Neg Hx    Thyroid disease Neg Hx     Social History   Socioeconomic History   Marital status: Single    Spouse name: Not on file   Number of children: Not on file   Years of education: Not on file   Highest education level: Not on file  Occupational History   Not on file  Tobacco Use   Smoking status: Former    Types: Cigars    Quit date: 06/11/2018    Years since quitting: 4.2   Smokeless tobacco: Never   Tobacco comments:    occ.   Vaping Use   Vaping Use: Never used  Substance and Sexual Activity   Alcohol use:  Yes    Alcohol/week: 2.0 standard drinks of alcohol    Types: 2 Cans of beer per week    Comment: occ.   Drug use: Never   Sexual activity: Yes  Other Topics Concern   Not on file  Social History Narrative   Not on file   Social Determinants of Health   Financial Resource Strain: Not on file  Food Insecurity: Not on file  Transportation Needs: Not on file  Physical Activity: Not on file  Stress: Not on file  Social Connections: Not on file  Intimate Partner Violence: Not on file    Review of Systems: See HPI, otherwise negative ROS  Physical Exam: BP (!) 148/100   Pulse 80   Temp (!) 97 F (36.1 C) (Temporal)   Resp 16   Wt 72.7 kg   SpO2 100%   BMI 22.34 kg/m   General:   Alert,  pleasant and cooperative in NAD Head:  Normocephalic and atraumatic. Neck:  Supple; no masses or thyromegaly. Lungs:  Clear throughout to auscultation.    Heart:  Regular rate and rhythm. Abdomen:  Soft, nontender and nondistended. Normal bowel sounds, without guarding, and without rebound.   Neurologic:  Alert and  oriented x4;  grossly normal neurologically.  Impression/Plan: Cameron Payne is here for an endoscopy and colonoscopy to be performed for dysphagia, ulcerative colitis  Risks, benefits, limitations, and alternatives regarding  endoscopy and colonoscopy have been reviewed with the patient.  Questions have been answered.  All parties agreeable.   Cameron Donath, MD  09/13/2022, 8:09 AM

## 2022-09-13 NOTE — Anesthesia Preprocedure Evaluation (Signed)
Anesthesia Evaluation  Patient identified by MRN, date of birth, ID band Patient awake    Reviewed: Allergy & Precautions, H&P , NPO status , Patient's Chart, lab work & pertinent test results, reviewed documented beta blocker date and time   History of Anesthesia Complications Negative for: history of anesthetic complications  Airway Mallampati: III   Neck ROM: full    Dental  (+) Teeth Intact, Dental Advidsory Given   Pulmonary neg pulmonary ROS, former smoker   Pulmonary exam normal        Cardiovascular Exercise Tolerance: Good hypertension, On Medications (-) angina (-) Past MI and (-) Cardiac Stents Normal cardiovascular exam(-) dysrhythmias (-) Valvular Problems/Murmurs Rhythm:regular Rate:Normal     Neuro/Psych Seizures - (as a child, none in years), Well Controlled,   negative psych ROS   GI/Hepatic Neg liver ROS, PUD,,,  Endo/Other  negative endocrine ROS    Renal/GU negative Renal ROS  negative genitourinary   Musculoskeletal   Abdominal   Peds  Hematology  (+) Blood dyscrasia, anemia   Anesthesia Other Findings Past Medical History: No date: Developmental disability No date: Hypertension No date: Seizures (HCC)     Comment:  as a child Past Surgical History: 11/22/2017: COLONOSCOPY WITH PROPOFOL; N/A     Comment:  Procedure: COLONOSCOPY WITH PROPOFOL;  Surgeon:               Pasty Spillers, MD;  Location: [facility] ENDOSCOPY;                Service: Endoscopy;  Laterality: N/A; 01/10/2019: COLONOSCOPY WITH PROPOFOL; N/A     Comment:  Procedure: COLONOSCOPY WITH PROPOFOL;  Surgeon:               Pasty Spillers, MD;  Location: [facility] ENDOSCOPY;                Service: Endoscopy;  Laterality: N/A; 11/22/2017: ESOPHAGOGASTRODUODENOSCOPY (EGD) WITH PROPOFOL; N/A     Comment:  Procedure: ESOPHAGOGASTRODUODENOSCOPY (EGD) WITH               PROPOFOL;  Surgeon: Pasty Spillers, MD;  Location:                [facility] ENDOSCOPY;  Service: Endoscopy;  Laterality: N/A; BMI    Body Mass Index: 22.08 kg/m     Reproductive/Obstetrics negative OB ROS                              Anesthesia Physical Anesthesia Plan  ASA: 2  Anesthesia Plan: General   Post-op Pain Management:    Induction: Intravenous  PONV Risk Score and Plan: 2 and Propofol infusion and TIVA  Airway Management Planned: Natural Airway and Nasal Cannula  Additional Equipment:   Intra-op Plan:   Post-operative Plan:   Informed Consent: I have reviewed the patients History and Physical, chart, labs and discussed the procedure including the risks, benefits and alternatives for the proposed anesthesia with the patient or authorized representative who has indicated his/her understanding and acceptance.     Dental Advisory Given  Plan Discussed with: CRNA  Anesthesia Plan Comments:          Anesthesia Quick Evaluation

## 2022-09-13 NOTE — Transfer of Care (Signed)
Immediate Anesthesia Transfer of Care Note  Patient: [name]  Procedure(s) Performed: COLONOSCOPY WITH PROPOFOL ESOPHAGOGASTRODUODENOSCOPY (EGD)  Patient Location: Endoscopy Unit  Anesthesia Type:General  Level of Consciousness: drowsy  Airway & Oxygen Therapy: Patient Spontanous Breathing  Post-op Assessment: Report given to RN and Post -op Vital signs reviewed and stable  Post vital signs: Reviewed and stable  Last Vitals:  Vitals Value Taken Time  BP    Temp    Pulse 78 09/13/22 0903  Resp 22 09/13/22 0903  SpO2 99 % 09/13/22 0903  Vitals shown include unvalidated device data.  Last Pain:  Vitals:   09/13/22 0708  TempSrc: Temporal         Complications: No notable events documented.

## 2022-09-14 ENCOUNTER — Encounter: Payer: Self-pay | Admitting: Gastroenterology

## 2022-09-14 LAB — SURGICAL PATHOLOGY

## 2022-09-15 ENCOUNTER — Telehealth: Payer: Self-pay

## 2022-09-15 MED ORDER — MESALAMINE 4 G RE ENEM: 4.0000 g | ENEMA | Freq: Every day | RECTAL | 0 refills | Status: DC

## 2022-09-15 NOTE — Telephone Encounter (Signed)
Called patient mother and patient mother verbalized understanding of results. She is afraid that he might not be able to do the Enema since he special needs but she will see if he can do it and give it a try. She made follow up appointment for 10/17/2022

## 2022-09-15 NOTE — Telephone Encounter (Signed)
-----   Message from Toney Reil, MD sent at 09/14/2022  4:21 PM EDT ----- Please inform patient's mom that the pathology results from colonoscopy confirmed that he has moderately active colitis in the rectum and sigmoid colon which means he has active inflammation secondary to colitis.  Therefore, in addition to Lialda, recommend Rowasa 4g enema daily at bedtime for 2 weeks  I should see him for follow-up in 4 to 6 weeks, when I am on-call  Rohini Vanga

## 2022-09-16 ENCOUNTER — Telehealth: Payer: Self-pay | Admitting: Gastroenterology

## 2022-09-16 NOTE — Telephone Encounter (Signed)
Pt mother Cameron Payne left message that pt goes to bathroom more frequently  6to8 times a day and having loose stools please return call

## 2022-09-19 NOTE — Telephone Encounter (Signed)
Rowasa enema in fact could help in improving the consistency of his stools by controlling the inflammation in his rectum.  I do recommend him to try enema  RV

## 2022-09-19 NOTE — Telephone Encounter (Signed)
Patient states the stool is soft stool. Denies abdominal pain, nausea or vomiting, blood in stool. Has not started the enema because she was afraid it was going to make him go more with the enema

## 2022-09-20 NOTE — Telephone Encounter (Signed)
Patient mother verbalized understanding and will have him try the medication

## 2022-10-05 ENCOUNTER — Encounter: Payer: Self-pay | Admitting: Gastroenterology

## 2022-10-17 ENCOUNTER — Encounter: Payer: Self-pay | Admitting: Gastroenterology

## 2022-10-17 ENCOUNTER — Ambulatory Visit (INDEPENDENT_AMBULATORY_CARE_PROVIDER_SITE_OTHER): Payer: 59 | Admitting: Gastroenterology

## 2022-10-17 VITALS — BP 137/89 | HR 82 | Temp 98.0°F | Ht 71.0 in | Wt 158.2 lb

## 2022-10-17 DIAGNOSIS — K5130 Ulcerative (chronic) rectosigmoiditis without complications: Secondary | ICD-10-CM

## 2022-10-17 MED ORDER — MESALAMINE 4 G RE ENEM: 4.0000 g | ENEMA | Freq: Every day | RECTAL | 1 refills | Status: DC

## 2022-10-17 NOTE — Progress Notes (Signed)
Cameron Repress, MD [address]  [address]  [place], [place] [address]  Main: [phone]  Fax: [phone]    Gastroenterology Consultation  Referring Provider:     Mickel Fuchs, MD Primary Care Physician:  Mickel Fuchs, MD Primary Gastroenterologist:  Dr. [name] Reason for Consultation:     Ulcerative colitis        HPI:   Cameron Payne is a 47 y.o. adult referred by Dr. Mickel Fuchs, MD  for consultation & management of ulcerative colitis.    IBD diagnosis: Diagnosed with ulcerative pancolitis based on the index colonoscopy on 11/22/2017  Disease course: Clinical presentation at the time of diagnosis include rectal bleeding, loose stools mixed with blood, weight loss in May 2019, underwent upper endoscopy as well as colonoscopy.  EGD was normal.  Colonoscopy revealed mild erythema in the cecum and rectum, pathology revealed mild active colitis in the cecum, moderate active colitis in the rectum with crypt abscess.  Patient was started on Canasa suppository as well as oral mesalamine since diagnosis.  Fecal calprotectin levels at the time of diagnosis were 126, peaked at 483 in 6/21.  Follow-up colonoscopy revealed mild chronic active colitis in the entire colon.  Patient has been maintained on Lialda 2.4 g daily.  Subsequently, fecal calprotectin levels have normalized in 10/21, levels increased to 135 in 03/2021 followed by being normal in 3/23.  Colonoscopy in 2/22 revealed quiescent colitis.  Fecal calprotectin levels were elevated to 572 in 06/2022 compared to completely normal in 07/2021.  GI profile PCR negative for infection.  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  Therefore, I have started him on Rowasa enema and increase Lialda to 2.4 g twice daily.  However, patient has been taking Lialda only once daily.  He finished 2 weeks course of Rowasa enema and reports  that his stools are less frequent, less amount as well as softer.  He denies any rectal bleeding.  His weight has been stable.  Patient is accompanied by his mom today.  Extra intestinal manifestations: None  IBD surgical history: None  Imaging:  MRE none CTE none SBFT none  Procedures: Colonoscopy 11/22/2017 - The examination was suspicious for ulcerative proctitis - The examined portion of the ileum was normal. Biopsied. - Erythematous mucosa at the appendiceal orifice. Biopsied. - Patchy mild mucosal changes were found in the rectum, rule out ulcerative colitis. Biopsied. - The sigmoid colon, descending colon, transverse colon, ascending colon and cecum are normal. Biopsied. - The distal rectum and anal verge are normal on retroflexion view.  DIAGNOSIS:   B.  TERMINAL ILEUM; COLD BIOPSY:  - SMALL BOWEL MUCOSA WITH INTACT VILLOUS ARCHITECTURE.  - NEGATIVE FOR INTRAEPITHELIAL LYMPHOCYTOSIS, DYSPLASIA AND MALIGNANCY.   C.  COLON, APPENDICEAL ORIFICE; COLD BIOPSY:  - MILD ACTIVE COLITIS.  - NEGATIVE FOR DYSPLASIA AND MALIGNANCY.   D.  COLON, CECUM AND ASCENDING; COLD BIOPSY:  - MUCOSAL EDEMA AND PROMINENT LYMPHOID AGGREGATES.  - NEGATIVE FOR DYSPLASIA AND MALIGNANCY.   E.  TRANSVERSE COLON; COLD BIOPSY:  - MUCOSAL HEMORRHAGE AND PROMINENT LYMPHOID AGGREGATES.  - NEGATIVE FOR DYSPLASIA AND MALIGNANCY.   F.  COLON, DESCENDING AND SIGMOID; COLD BIOPSY:  - MUCOSAL HEMORRHAGE.  - NEGATIVE FOR DYSPLASIA AND MALIGNANCY.   G.  RECTUM; COLD BIOPSY:  - MODERATE ACTIVE COLITIS/PROCTITIS WITH ARCHITECTURAL FEATURES OF  CHRONICITY.  - NEGATIVE FOR DYSPLASIA AND MALIGNANCY.   Colonoscopy 01/10/2019 -  Erythematous mucosa in the cecum. Biopsied. - Erythematous mucosa in the transverse colon and in the ascending colon. Biopsied. - Erythematous mucosa in the sigmoid colon. Biopsied. - Erythematous mucosa in the rectum. Biopsied. - The distal rectum and anal verge are normal on  retroflexion view. - Biopsies were obtained in the descending colon, in the transverse colon and in the ascending colon. - The areas of erythema were in the periappendiceal region of the cecum, very patchy erythema in the ascending, transverse and sigmoid colon. Mild pathcy erythema in the rectum. Rest of the colon was normal. DIAGNOSIS:  A.  CECUM ERYTHEMA; COLD BIOPSY:  - MILD TO MODERATE CHRONIC ACTIVE COLITIS IN 2 OF 3 FRAGMENTS.   B.  ASCENDING, TRANSVERSE, AND DESCENDING COLON; COLD BIOPSY:  - MILD CHRONIC ACTIVE COLITIS INVOLVING 1 OF 10 FRAGMENTS.   C.  SIGMOID COLON ERYTHEMA; COLD BIOPSY:  - MILD CHRONIC ACTIVE COLITIS IN 2 OF 6 FRAGMENTS.   D.  RECTUM ERYTHEMA; COLD BIOPSY:  - MILD CHRONIC ACTIVE PROCTITIS IN 2 OF 2 FRAGMENTS.   Colonoscopy 07/09/2020 - One 4 mm polyp in the descending colon, removed with a cold biopsy forceps. Resected and retrieved. - The examination was otherwise normal. - The rectum, sigmoid colon, descending colon, transverse colon, ascending colon, cecum and terminal ileum are normal. Biopsied. - The distal rectum and anal verge are normal on retroflexion view. DIAGNOSIS:  A.  TERMINAL ILEUM; COLD BIOPSY:  - ILEAL MUCOSA WITH INTACT VILLI.  - NEGATIVE FOR ACTIVE INFLAMMATION, INTRAEPITHELIAL LYMPHOCYTOSIS, AND  INFECTIOUS AGENTS.   B.  CECUM; COLD BIOPSY:  - COLONIC MUCOSA WITH INTACT CRYPT ARCHITECTURE.  - NO EVIDENCE OF COLITIS   C.  ASCENDING COLON; COLD BIOPSY:  - COLONIC MUCOSA WITH INTACT CRYPT ARCHITECTURE.  - NO EVIDENCE OF COLITIS.   D.  TRANSVERSE COLON; COLD BIOPSY:  - ONE OF 2 FRAGMENTS WITH FEATURES OF CRYPT DROPOUT.  - NO EVIDENCE OF ACTIVE INFLAMMATION   E.  DESCENDING COLON; COLD BIOPSY:  - TWO FRAGMENTS WITH FEATURES OF CRYPT DROPOUT.  - NO EVIDENCE OF ACTIVE INFLAMMATION.   F.  COLON POLYP, DESCENDING; COLD BIOPSY:  - TUBULAR ADENOMA.  - NEGATIVE FOR HIGH-GRADE DYSPLASIA AND MALIGNANCY.   G.  SIGMOID COLON; COLD  BIOPSY:  - MILDLY ALTERED CRYPT ARCHITECTURE AND MILD INFILTRATES OF PLASMA CELLS  AND EOSINOPHILS EXTENDING TO THE CRYPT BASES, SEE COMMENT.  - NO EVIDENCE OF ACTIVE INFLAMMATION.   H.  RECTUM; COLD BIOPSY:  - MILDLY ALTERED CRYPT ARCHITECTURE WITHOUT SIGNIFICANT CHRONIC  INFLAMMATION.  - NO EVIDENCE OF ACTIVE INFLAMMATION.   Upper Endoscopy 11/22/2017 - White nummular lesions in esophageal mucosa. Brushings performed. - Normal stomach. - Normal duodenal bulb, second portion of the duodenum and examined duodenum. Biopsied. A.  DUODENUM, SECOND PORTION END BULB; COLD BIOPSY:  - DUODENAL MUCOSA WITH PRESERVED VILLOUS ARCHITECTURE AND FOCAL  BRUNNER'S GLAND HYPERPLASIA.  - NEGATIVE FOR INTRA-EPITHELIAL LYMPHOCYTOSIS, DYSPLASIA AND MALIGNANCY.   VCE none  IBD medications:  Steroids: None  5-ASA: Lialda 2.4 g daily since 10/2017, Canasa suppository at bedtime since 10/2017, at the time of diagnosis, stopped in 3/23 Immunomodulators: AZA, methotrexate: None TPMT status unknown Biologics: Nave Anti TNFs: Anti Integrins: Ustekinumab: Tofactinib: Clinical trial:  NSAIDs: None  Antiplts/Anticoagulants/Anti thrombotics: None  Past Medical History:  Diagnosis Date   Developmental disability    Hypertension    Mild chronic ulcerative colitis without complication (HCC)    Seizures (HCC)    as a child   Syncope  and collapse 07/09/2013    Past Surgical History:  Procedure Laterality Date   COLONOSCOPY WITH PROPOFOL N/A 11/22/2017   Procedure: COLONOSCOPY WITH PROPOFOL;  Surgeon: Pasty Spillers, MD;  Location: [facility] ENDOSCOPY;  Service: Endoscopy;  Laterality: N/A;   COLONOSCOPY WITH PROPOFOL N/A 01/10/2019   Procedure: COLONOSCOPY WITH PROPOFOL;  Surgeon: Pasty Spillers, MD;  Location: [facility] ENDOSCOPY;  Service: Endoscopy;  Laterality: N/A;   COLONOSCOPY WITH PROPOFOL N/A 07/09/2020   Procedure: COLONOSCOPY WITH PROPOFOL;  Surgeon: Pasty Spillers, MD;  Location:  [facility] ENDOSCOPY;  Service: Endoscopy;  Laterality: N/A;   COLONOSCOPY WITH PROPOFOL N/A 09/13/2022   Procedure: COLONOSCOPY WITH PROPOFOL;  Surgeon: Toney Reil, MD;  Location: [facility] ENDOSCOPY;  Service: Gastroenterology;  Laterality: N/A;   ESOPHAGOGASTRODUODENOSCOPY N/A 09/13/2022   Procedure: ESOPHAGOGASTRODUODENOSCOPY (EGD);  Surgeon: Toney Reil, MD;  Location: [facility] ENDOSCOPY;  Service: Gastroenterology;  Laterality: N/A;   ESOPHAGOGASTRODUODENOSCOPY (EGD) WITH PROPOFOL N/A 11/22/2017   Procedure: ESOPHAGOGASTRODUODENOSCOPY (EGD) WITH PROPOFOL;  Surgeon: Pasty Spillers, MD;  Location: [facility] ENDOSCOPY;  Service: Endoscopy;  Laterality: N/A;   TONSILLECTOMY     Current Outpatient Medications:    fexofenadine (ALLEGRA) 180 MG tablet, Take 180 mg by mouth daily., Disp: , Rfl:    folic acid (FOLVITE) 400 MCG tablet, Take 1 tablet (400 mcg total) by mouth daily., Disp: 90 tablet, Rfl: 1   mesalamine (LIALDA) 1.2 g EC tablet, Take 2 tablets (2.4 g total) by mouth 2 (two) times daily., Disp: 120 tablet, Rfl: 2   Ascorbic Acid (VITAMIN C ADULT GUMMIES) 125 MG CHEW, Chew by mouth. (Patient not taking: Reported on 07/04/2022), Disp: , Rfl:    Calcium-Phosphorus-Vitamin D (CALCIUM/VITAMIN D3/ADULT GUMMY PO), Take by mouth. (Patient not taking: Reported on 07/04/2022), Disp: , Rfl:    ELDERBERRY PO, Take 1 capsule by mouth 1 day or 1 dose. (Patient not taking: Reported on 07/04/2022), Disp: , Rfl:    mesalamine (ROWASA) 4 g enema, Place 60 mLs (4 g total) rectally at bedtime., Disp: 1800 mL, Rfl: 1  Family History  Problem Relation Age of Onset   Diabetes Mother    Heart failure Mother    Cancer Father        liver   Cancer Paternal [name]        does not know what kind   Cancer Maternal Aunt        breast (half sister) pt mother was adopted   Colon cancer Neg Hx    Thyroid disease Neg Hx      Social History   Tobacco Use   Smoking status: Former    Types: Cigars    Quit  date: 06/11/2018    Years since quitting: 4.3   Smokeless tobacco: Never   Tobacco comments:    occ.   Vaping Use   Vaping Use: Never used  Substance Use Topics   Alcohol use: Yes    Alcohol/week: 2.0 standard drinks of alcohol    Types: 2 Cans of beer per week    Comment: occ.   Drug use: Never    Allergies as of 10/17/2022   (No Known Allergies)    Review of Systems:    All systems reviewed and negative except where noted in HPI.   Physical Exam:  BP 137/89 (BP Location: Left Arm, Patient Position: Sitting, Cuff Size: Normal)   Pulse 82   Temp 98 F (36.7 C) (Oral)   Ht 5\' 11"  (1.803 m)   Wt 158  lb 4 oz (71.8 kg)   BMI 22.07 kg/m  No LMP recorded.  General:   Alert,  Well-developed, well-nourished, pleasant and cooperative in NAD Head:  Normocephalic and atraumatic. Eyes:  Sclera clear, no icterus.   Conjunctiva pink. Ears:  Normal auditory acuity. Nose:  No deformity, discharge, or lesions. Mouth:  No deformity or lesions,oropharynx pink & moist. Neck:  Supple; no masses or thyromegaly. Lungs:  Respirations even and unlabored.  Clear throughout to auscultation.   No wheezes, crackles, or rhonchi. No acute distress. Heart:  Regular rate and rhythm; no murmurs, clicks, rubs, or gallops. Abdomen:  Normal bowel sounds. Soft, non-tender and non-distended without masses, hepatosplenomegaly or hernias noted.  No guarding or rebound tenderness.   Rectal: Not performed Msk:  Symmetrical without gross deformities. Good, equal movement & strength bilaterally. Pulses:  Normal pulses noted. Extremities:  No clubbing or edema.  No cyanosis. Neurologic:  Alert and oriented x3;  grossly normal neurologically. Skin:  Intact without significant lesions or rashes. No jaundice. Psych:  Alert and cooperative. Normal mood and affect.  Assessment and Plan:   Cameron Payne is a 47 y.o. pleasant African-American male with ulcerative pancolitis diagnosed based on the colonoscopy in  12/2018, maintained on Lialda 2.4 g daily in clinical and histologic remission until 08/2022, found to have elevated fecal calprotectin levels and chronic moderately active proctocolitis  Chronic moderately active ulcerative proctosigmoiditis Increase Lialda to 2.4 g twice daily Continue Rowasa enema every other day, finished 2 weeks course of Rowasa enema nightly Recheck fecal calprotectin levels in 1 month Discussed with patient's mother about role of various Biologics if his colitis is not under control   Microcytic anemia Patient is evaluated by hematology, diagnosed with alpha thalassemia trait No evidence of iron deficiency  Follow up in 3 to 4 months  Cameron Repress, MD

## 2022-12-16 ENCOUNTER — Telehealth: Payer: Self-pay | Admitting: Gastroenterology

## 2022-12-16 NOTE — Telephone Encounter (Signed)
Pt pharmacy [facility] [place]

## 2022-12-16 NOTE — Telephone Encounter (Signed)
I do not know what pharmacy they are requesting the medication to go can you please document this information?

## 2022-12-16 NOTE — Telephone Encounter (Signed)
Pt mother  wanda called to see if pt needs to get refill on his enema solution please return call

## 2023-01-31 ENCOUNTER — Telehealth: Payer: Self-pay

## 2023-01-31 MED ORDER — MESALAMINE 1.2 G PO TBEC: 2.4000 g | DELAYED_RELEASE_TABLET | Freq: Two times a day (BID) | ORAL | 0 refills | Status: DC

## 2023-01-31 NOTE — Telephone Encounter (Signed)
Last office visit 10/17/2022 Chronic Ulcerative rectosigmoiditis  Last refill 02/12/024 2 refills  Has appointment 02/06/2023

## 2023-02-06 ENCOUNTER — Ambulatory Visit (INDEPENDENT_AMBULATORY_CARE_PROVIDER_SITE_OTHER): Payer: 59 | Admitting: Gastroenterology

## 2023-02-06 ENCOUNTER — Encounter: Payer: Self-pay | Admitting: Gastroenterology

## 2023-02-06 VITALS — BP 130/85 | HR 80 | Temp 97.6°F | Ht 71.0 in | Wt 158.4 lb

## 2023-02-06 DIAGNOSIS — K5130 Ulcerative (chronic) rectosigmoiditis without complications: Secondary | ICD-10-CM

## 2023-02-06 DIAGNOSIS — D563 Thalassemia minor: Secondary | ICD-10-CM | POA: Diagnosis not present

## 2023-02-06 DIAGNOSIS — Z8719 Personal history of other diseases of the digestive system: Secondary | ICD-10-CM | POA: Diagnosis not present

## 2023-02-06 NOTE — Progress Notes (Signed)
Cameron Repress, MD [address]  [address]  [place], [place] [address]  Main: [phone]  Fax: [phone]    Gastroenterology Consultation  Referring Provider:     Mickel Fuchs, MD Primary Care Physician:  Mickel Fuchs, MD Primary Gastroenterologist:  Dr. [name] Reason for Consultation:     Ulcerative colitis        HPI:   Cameron Payne is a 47 y.o. adult referred by Dr. Mickel Fuchs, MD  for consultation & management of ulcerative colitis.   Follow-up visit 02/06/2023 Mr. Flenner is here for follow-up of ulcerative colitis.  He reports doing well on higher dose of mesalamine 2.4 g twice daily.  He is not on Rowasa enema.  Patient is accompanied by his mom today.  Patient denies any rectal bleeding, diarrhea, abdominal pain or bloating.  Patient does not have any concerns today   IBD diagnosis: Diagnosed with ulcerative pancolitis based on the index colonoscopy on 11/22/2017  Disease course: Clinical presentation at the time of diagnosis include rectal bleeding, loose stools mixed with blood, weight loss in May 2019, underwent upper endoscopy as well as colonoscopy.  EGD was normal.  Colonoscopy revealed mild erythema in the cecum and rectum, pathology revealed mild active colitis in the cecum, moderate active colitis in the rectum with crypt abscess.  Patient was started on Canasa suppository as well as oral mesalamine since diagnosis.  Fecal calprotectin levels at the time of diagnosis were 126, peaked at 483 in 6/21.  Follow-up colonoscopy revealed mild chronic active colitis in the entire colon.  Patient has been maintained on Lialda 2.4 g daily.  Subsequently, fecal calprotectin levels have normalized in 10/21, levels increased to 135 in 03/2021 followed by being normal in 3/23.  Colonoscopy in 2/22 revealed quiescent colitis.  Fecal calprotectin levels were elevated to 572 in 06/2022 compared to completely normal in 07/2021.  GI profile PCR negative for  infection.  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  Therefore, I have started him on Rowasa enema and increase Lialda to 2.4 g twice daily.  However, patient has been taking Lialda only once daily.  He finished 2 weeks course of Rowasa enema and reports that his stools are less frequent, less amount as well as softer.  He denies any rectal bleeding.  His weight has been stable.  Patient is accompanied by his mom today.  Extra intestinal manifestations: None  IBD surgical history: None  Imaging:  MRE none CTE none SBFT none  Procedures: Upper endoscopy and colonoscopy 09/13/2022 DIAGNOSIS:  A. ESOPHAGUS; COLD BIOPSY:  - SQUAMOUS MUCOSA WITH PATCHY NON-SPECIFIC SPONGIOSIS.  - NEGATIVE FOR INTRAEPITHELIAL EOSINOPHILS, DYSPLASIA, AND MALIGNANCY.   B.  COLON, RIGHT; COLD BIOPSY:  - UNREMARKABLE COLONIC MUCOSA.  - NEGATIVE FOR ACTIVE COLITIS, GRANULOMAS, DYSPLASIA, AND MALIGNANCY.   C.  COLON, LEFT; COLD BIOPSY:  - UNREMARKABLE COLONIC MUCOSA.  - NEGATIVE FOR ACTIVE COLITIS, GRANULOMAS, DYSPLASIA, AND MALIGNANCY.   D.  RECTUM AND RECTOSIGMOID; COLD BIOPSY:  - MODERATE CHRONIC ACTIVE PROCTOCOLITIS.  - NEGATIVE FOR GRANULOMAS, DYSPLASIA, AND MALIGNANCY.    Colonoscopy 11/22/2017 - The examination was suspicious for ulcerative proctitis - The examined portion of the ileum was normal. Biopsied. - Erythematous mucosa at the appendiceal orifice. Biopsied. - Patchy mild mucosal changes were found in the rectum, rule out ulcerative colitis. Biopsied. - The sigmoid colon, descending colon, transverse colon, ascending colon and cecum are normal.  Biopsied. - The distal rectum and anal verge are normal on retroflexion view.  DIAGNOSIS:   B.  TERMINAL ILEUM; COLD BIOPSY:  - SMALL BOWEL MUCOSA WITH INTACT VILLOUS ARCHITECTURE.  - NEGATIVE FOR INTRAEPITHELIAL LYMPHOCYTOSIS, DYSPLASIA AND MALIGNANCY.    C.  COLON, APPENDICEAL ORIFICE; COLD BIOPSY:  - MILD ACTIVE COLITIS.  - NEGATIVE FOR DYSPLASIA AND MALIGNANCY.   D.  COLON, CECUM AND ASCENDING; COLD BIOPSY:  - MUCOSAL EDEMA AND PROMINENT LYMPHOID AGGREGATES.  - NEGATIVE FOR DYSPLASIA AND MALIGNANCY.   E.  TRANSVERSE COLON; COLD BIOPSY:  - MUCOSAL HEMORRHAGE AND PROMINENT LYMPHOID AGGREGATES.  - NEGATIVE FOR DYSPLASIA AND MALIGNANCY.   F.  COLON, DESCENDING AND SIGMOID; COLD BIOPSY:  - MUCOSAL HEMORRHAGE.  - NEGATIVE FOR DYSPLASIA AND MALIGNANCY.   G.  RECTUM; COLD BIOPSY:  - MODERATE ACTIVE COLITIS/PROCTITIS WITH ARCHITECTURAL FEATURES OF  CHRONICITY.  - NEGATIVE FOR DYSPLASIA AND MALIGNANCY.   Colonoscopy 01/10/2019 - Erythematous mucosa in the cecum. Biopsied. - Erythematous mucosa in the transverse colon and in the ascending colon. Biopsied. - Erythematous mucosa in the sigmoid colon. Biopsied. - Erythematous mucosa in the rectum. Biopsied. - The distal rectum and anal verge are normal on retroflexion view. - Biopsies were obtained in the descending colon, in the transverse colon and in the ascending colon. - The areas of erythema were in the periappendiceal region of the cecum, very patchy erythema in the ascending, transverse and sigmoid colon. Mild pathcy erythema in the rectum. Rest of the colon was normal. DIAGNOSIS:  A.  CECUM ERYTHEMA; COLD BIOPSY:  - MILD TO MODERATE CHRONIC ACTIVE COLITIS IN 2 OF 3 FRAGMENTS.   B.  ASCENDING, TRANSVERSE, AND DESCENDING COLON; COLD BIOPSY:  - MILD CHRONIC ACTIVE COLITIS INVOLVING 1 OF 10 FRAGMENTS.   C.  SIGMOID COLON ERYTHEMA; COLD BIOPSY:  - MILD CHRONIC ACTIVE COLITIS IN 2 OF 6 FRAGMENTS.   D.  RECTUM ERYTHEMA; COLD BIOPSY:  - MILD CHRONIC ACTIVE PROCTITIS IN 2 OF 2 FRAGMENTS.   Colonoscopy 07/09/2020 - One 4 mm polyp in the descending colon, removed with a cold biopsy forceps. Resected and retrieved. - The examination was otherwise normal. - The rectum, sigmoid colon,  descending colon, transverse colon, ascending colon, cecum and terminal ileum are normal. Biopsied. - The distal rectum and anal verge are normal on retroflexion view. DIAGNOSIS:  A.  TERMINAL ILEUM; COLD BIOPSY:  - ILEAL MUCOSA WITH INTACT VILLI.  - NEGATIVE FOR ACTIVE INFLAMMATION, INTRAEPITHELIAL LYMPHOCYTOSIS, AND  INFECTIOUS AGENTS.   B.  CECUM; COLD BIOPSY:  - COLONIC MUCOSA WITH INTACT CRYPT ARCHITECTURE.  - NO EVIDENCE OF COLITIS   C.  ASCENDING COLON; COLD BIOPSY:  - COLONIC MUCOSA WITH INTACT CRYPT ARCHITECTURE.  - NO EVIDENCE OF COLITIS.   D.  TRANSVERSE COLON; COLD BIOPSY:  - ONE OF 2 FRAGMENTS WITH FEATURES OF CRYPT DROPOUT.  - NO EVIDENCE OF ACTIVE INFLAMMATION   E.  DESCENDING COLON; COLD BIOPSY:  - TWO FRAGMENTS WITH FEATURES OF CRYPT DROPOUT.  - NO EVIDENCE OF ACTIVE INFLAMMATION.   F.  COLON POLYP, DESCENDING; COLD BIOPSY:  - TUBULAR ADENOMA.  - NEGATIVE FOR HIGH-GRADE DYSPLASIA AND MALIGNANCY.   G.  SIGMOID COLON; COLD BIOPSY:  - MILDLY ALTERED CRYPT ARCHITECTURE AND MILD INFILTRATES OF PLASMA CELLS  AND EOSINOPHILS EXTENDING TO THE CRYPT BASES, SEE COMMENT.  - NO EVIDENCE OF ACTIVE INFLAMMATION.   H.  RECTUM; COLD BIOPSY:  - MILDLY ALTERED CRYPT ARCHITECTURE WITHOUT SIGNIFICANT CHRONIC  INFLAMMATION.  - NO  EVIDENCE OF ACTIVE INFLAMMATION.   Upper Endoscopy 11/22/2017 - White nummular lesions in esophageal mucosa. Brushings performed. - Normal stomach. - Normal duodenal bulb, second portion of the duodenum and examined duodenum. Biopsied. A.  DUODENUM, SECOND PORTION END BULB; COLD BIOPSY:  - DUODENAL MUCOSA WITH PRESERVED VILLOUS ARCHITECTURE AND FOCAL  BRUNNER'S GLAND HYPERPLASIA.  - NEGATIVE FOR INTRA-EPITHELIAL LYMPHOCYTOSIS, DYSPLASIA AND MALIGNANCY.   VCE none  IBD medications:  Steroids: None  5-ASA: Lialda 2.4 g daily since 10/2017, Canasa suppository at bedtime since 10/2017, at the time of diagnosis, stopped in 3/23 Immunomodulators:  AZA, methotrexate: None TPMT status unknown Biologics: Nave Anti TNFs: Anti Integrins: Ustekinumab: Tofactinib: Clinical trial:  NSAIDs: None  Antiplts/Anticoagulants/Anti thrombotics: None  Past Medical History:  Diagnosis Date   Developmental disability    Hypertension    Mild chronic ulcerative colitis without complication (HCC)    Seizures (HCC)    as a child   Syncope and collapse 07/09/2013    Past Surgical History:  Procedure Laterality Date   COLONOSCOPY WITH PROPOFOL N/A 11/22/2017   Procedure: COLONOSCOPY WITH PROPOFOL;  Surgeon: Pasty Spillers, MD;  Location: [facility] ENDOSCOPY;  Service: Endoscopy;  Laterality: N/A;   COLONOSCOPY WITH PROPOFOL N/A 01/10/2019   Procedure: COLONOSCOPY WITH PROPOFOL;  Surgeon: Pasty Spillers, MD;  Location: [facility] ENDOSCOPY;  Service: Endoscopy;  Laterality: N/A;   COLONOSCOPY WITH PROPOFOL N/A 07/09/2020   Procedure: COLONOSCOPY WITH PROPOFOL;  Surgeon: Pasty Spillers, MD;  Location: [facility] ENDOSCOPY;  Service: Endoscopy;  Laterality: N/A;   COLONOSCOPY WITH PROPOFOL N/A 09/13/2022   Procedure: COLONOSCOPY WITH PROPOFOL;  Surgeon: Toney Reil, MD;  Location[facility] ENDOSCOPY;  Service: Gastroenterology;  Laterality: N/A;   ESOPHAGOGASTRODUODENOSCOPY N/A 09/13/2022   Procedure: ESOPHAGOGASTRODUODENOSCOPY (EGD);  Surgeon: Toney Reil, MD;  Location: [facility] ENDOSCOPY;  Service: Gastroenterology;  Laterality: N/A;   ESOPHAGOGASTRODUODENOSCOPY (EGD) WITH PROPOFOL N/A 11/22/2017   Procedure: ESOPHAGOGASTRODUODENOSCOPY (EGD) WITH PROPOFOL;  Surgeon: Pasty Spillers, MD;  Location: [facility] ENDOSCOPY;  Service: Endoscopy;  Laterality: N/A;   TONSILLECTOMY     Current Outpatient Medications:    fexofenadine (ALLEGRA) 180 MG tablet, Take 180 mg by mouth daily., Disp: , Rfl:    folic acid (FOLVITE) 400 MCG tablet, Take 1 tablet (400 mcg total) by mouth daily., Disp: 90 tablet, Rfl: 1   mesalamine (LIALDA) 1.2 g EC  tablet, Take 2 tablets (2.4 g total) by mouth 2 (two) times daily., Disp: 120 tablet, Rfl: 0  Family History  Problem Relation Age of Onset   Diabetes Mother    Heart failure Mother    Cancer Father        liver   Cancer Paternal [name]        does not know what kind   Cancer Maternal Aunt        breast (half sister) pt mother was adopted   Colon cancer Neg Hx    Thyroid disease Neg Hx      Social History   Tobacco Use   Smoking status: Former    Types: Cigars    Quit date: 06/11/2018    Years since quitting: 4.6   Smokeless tobacco: Never   Tobacco comments:    occ.   Vaping Use   Vaping status: Never Used  Substance Use Topics   Alcohol use: Not Currently    Alcohol/week: 2.0 standard drinks of alcohol    Types: 2 Cans of beer per week    Comment: occ.   Drug use:  Never    Allergies as of 02/06/2023   (No Known Allergies)    Review of Systems:    All systems reviewed and negative except where noted in HPI.   Physical Exam:  BP 130/85 (BP Location: Left Arm, Patient Position: Sitting, Cuff Size: Normal)   Pulse 80   Temp 97.6 F (36.4 C) (Oral)   Ht 5\' 11"  (1.803 m)   Wt 158 lb 6 oz (71.8 kg)   BMI 22.09 kg/m  No LMP recorded.  General:   Alert,  Well-developed, well-nourished, pleasant and cooperative in NAD Head:  Normocephalic and atraumatic. Eyes:  Sclera clear, no icterus.   Conjunctiva pink. Ears:  Normal auditory acuity. Nose:  No deformity, discharge, or lesions. Mouth:  No deformity or lesions,oropharynx pink & moist. Neck:  Supple; no masses or thyromegaly. Lungs:  Respirations even and unlabored.  Clear throughout to auscultation.   No wheezes, crackles, or rhonchi. No acute distress. Heart:  Regular rate and rhythm; no murmurs, clicks, rubs, or gallops. Abdomen:  Normal bowel sounds. Soft, non-tender and non-distended without masses, hepatosplenomegaly or hernias noted.  No guarding or rebound tenderness.   Rectal: Not performed Msk:   Symmetrical without gross deformities. Good, equal movement & strength bilaterally. Pulses:  Normal pulses noted. Extremities:  No clubbing or edema.  No cyanosis. Neurologic:  Alert and oriented x3;  grossly normal neurologically. Skin:  Intact without significant lesions or rashes. No jaundice. Psych:  Alert and cooperative. Normal mood and affect.  Assessment and Plan:   MYKING JINDRA is a 47 y.o. 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

## 2023-02-08 LAB — QUANTIFERON-TB GOLD PLUS
QuantiFERON Mitogen Value: >10 [IU]/mL
QuantiFERON Nil Value: 0.07 [IU]/mL
QuantiFERON TB1 Ag Value: 0.03 [IU]/mL
QuantiFERON TB2 Ag Value: 0.05 [IU]/mL
QuantiFERON-TB Gold Plus: NEGATIVE

## 2023-02-08 LAB — HEPATITIS B SURFACE ANTIGEN: Hepatitis B Surface Ag: NEGATIVE

## 2023-02-08 LAB — HEPATITIS B SURFACE ANTIBODY,QUALITATIVE: Hep B Surface Ab, Qual: NONREACTIVE

## 2023-02-08 LAB — HEPATITIS B CORE ANTIBODY, TOTAL: Hep B Core Total Ab: NEGATIVE

## 2023-02-10 LAB — CALPROTECTIN, FECAL: Calprotectin, Fecal: <5 ug/g (ref 0–120)

## 2023-02-13 ENCOUNTER — Telehealth: Payer: Self-pay

## 2023-02-13 NOTE — Telephone Encounter (Signed)
Patient mother Cameron Payne who is on patient DPR verbalized understanding of results.

## 2023-02-13 NOTE — Telephone Encounter (Signed)
-----   Message from [facility] sent at 02/10/2023 12:37 PM EDT ----- Please inform patient's mom that his stool studies came back normal which is a marker of inflammation of his underlying colitis.  This is good news.  His blood test results also came back normal.  He should continue current dose of mesalamine.  I do not recommend any change in his treatment at this time  RV

## 2023-03-07 ENCOUNTER — Other Ambulatory Visit: Payer: Self-pay | Admitting: Gastroenterology

## 2023-03-07 NOTE — Telephone Encounter (Signed)
Can he get 90 days

## 2023-03-08 ENCOUNTER — Telehealth: Payer: Self-pay

## 2023-03-08 NOTE — Telephone Encounter (Signed)
Received a fax from a pharmacy for a refill for the Mesalamine  Last office visit 02/06/2023 Chronic Ulcerative Rectosigmoiditis   Last refill 03/07/2023 for 90 days 1 refills

## 2023-04-10 ENCOUNTER — Encounter: Payer: Self-pay | Admitting: Oncology

## 2023-04-10 ENCOUNTER — Inpatient Hospital Stay: Payer: 59 | Admitting: Oncology

## 2023-04-10 ENCOUNTER — Inpatient Hospital Stay: Payer: 59 | Attending: Oncology

## 2023-04-25 ENCOUNTER — Other Ambulatory Visit: Payer: Self-pay

## 2023-04-25 DIAGNOSIS — D509 Iron deficiency anemia, unspecified: Secondary | ICD-10-CM

## 2023-04-25 DIAGNOSIS — D563 Thalassemia minor: Secondary | ICD-10-CM

## 2023-04-26 ENCOUNTER — Inpatient Hospital Stay: Payer: 59 | Admitting: Oncology

## 2023-04-26 ENCOUNTER — Inpatient Hospital Stay: Payer: 59

## 2023-05-01 ENCOUNTER — Encounter: Payer: Self-pay | Admitting: Oncology

## 2023-05-01 ENCOUNTER — Inpatient Hospital Stay: Payer: 59 | Attending: Oncology

## 2023-05-01 ENCOUNTER — Inpatient Hospital Stay (HOSPITAL_BASED_OUTPATIENT_CLINIC_OR_DEPARTMENT_OTHER): Payer: 59 | Admitting: Oncology

## 2023-05-01 VITALS — BP 150/89 | HR 79 | Temp 96.4°F | Resp 18 | Wt 155.8 lb

## 2023-05-01 DIAGNOSIS — Z833 Family history of diabetes mellitus: Secondary | ICD-10-CM | POA: Diagnosis not present

## 2023-05-01 DIAGNOSIS — Z803 Family history of malignant neoplasm of breast: Secondary | ICD-10-CM | POA: Diagnosis not present

## 2023-05-01 DIAGNOSIS — Z9089 Acquired absence of other organs: Secondary | ICD-10-CM | POA: Diagnosis not present

## 2023-05-01 DIAGNOSIS — Z808 Family history of malignant neoplasm of other organs or systems: Secondary | ICD-10-CM | POA: Diagnosis not present

## 2023-05-01 DIAGNOSIS — Z8249 Family history of ischemic heart disease and other diseases of the circulatory system: Secondary | ICD-10-CM | POA: Insufficient documentation

## 2023-05-01 DIAGNOSIS — I10 Essential (primary) hypertension: Secondary | ICD-10-CM | POA: Diagnosis not present

## 2023-05-01 DIAGNOSIS — Z87891 Personal history of nicotine dependence: Secondary | ICD-10-CM | POA: Diagnosis not present

## 2023-05-01 DIAGNOSIS — D563 Thalassemia minor: Secondary | ICD-10-CM

## 2023-05-01 DIAGNOSIS — D509 Iron deficiency anemia, unspecified: Secondary | ICD-10-CM | POA: Diagnosis not present

## 2023-05-01 DIAGNOSIS — K5130 Ulcerative (chronic) rectosigmoiditis without complications: Secondary | ICD-10-CM | POA: Insufficient documentation

## 2023-05-01 DIAGNOSIS — Z809 Family history of malignant neoplasm, unspecified: Secondary | ICD-10-CM | POA: Diagnosis not present

## 2023-05-01 DIAGNOSIS — Z79899 Other long term (current) drug therapy: Secondary | ICD-10-CM | POA: Diagnosis not present

## 2023-05-01 LAB — CBC WITH DIFFERENTIAL (CANCER CENTER ONLY)
Abs Immature Granulocytes: 0.01 10*3/uL (ref 0.00–0.07)
Basophils Absolute: 0 10*3/uL (ref 0.0–0.1)
Basophils Relative: 0 %
Eosinophils Absolute: 0 10*3/uL (ref 0.0–0.5)
Eosinophils Relative: 0 %
HCT: 43.6 % (ref 39.0–52.0)
Hemoglobin: 12.8 g/dL — ABNORMAL LOW (ref 13.0–17.0)
Immature Granulocytes: 0 %
Lymphocytes Relative: 47 %
Lymphs Abs: 2.4 10*3/uL (ref 0.7–4.0)
MCH: 22.4 pg — ABNORMAL LOW (ref 26.0–34.0)
MCHC: 29.4 g/dL — ABNORMAL LOW (ref 30.0–36.0)
MCV: 76.4 fL — ABNORMAL LOW (ref 80.0–100.0)
Monocytes Absolute: 0.4 10*3/uL (ref 0.1–1.0)
Monocytes Relative: 8 %
Neutro Abs: 2.4 10*3/uL (ref 1.7–7.7)
Neutrophils Relative %: 45 %
Platelet Count: 218 10*3/uL (ref 150–400)
RBC: 5.71 MIL/uL (ref 4.22–5.81)
RDW: 13.4 % (ref 11.5–15.5)
WBC Count: 5.3 10*3/uL (ref 4.0–10.5)
nRBC: 0 % (ref 0.0–0.2)

## 2023-05-01 LAB — IRON AND TIBC
Iron: 92 ug/dL (ref 45–182)
Saturation Ratios: 31 % (ref 17.9–39.5)
TIBC: 301 ug/dL (ref 250–450)
UIBC: 209 ug/dL

## 2023-05-01 LAB — CMP (CANCER CENTER ONLY)
ALT: 12 U/L (ref 0–44)
AST: 16 U/L (ref 15–41)
Albumin: 4.5 g/dL (ref 3.5–5.0)
Alkaline Phosphatase: 43 U/L (ref 38–126)
Anion gap: 10 (ref 5–15)
BUN: 16 mg/dL (ref 6–20)
CO2: 26 mmol/L (ref 22–32)
Calcium: 9 mg/dL (ref 8.9–10.3)
Chloride: 106 mmol/L (ref 98–111)
Creatinine: 0.72 mg/dL (ref 0.61–1.24)
GFR, Estimated: >60 mL/min (ref >60)
Glucose, Bld: 90 mg/dL (ref 70–99)
Potassium: 3.8 mmol/L (ref 3.5–5.1)
Sodium: 142 mmol/L (ref 135–145)
Total Bilirubin: 0.5 mg/dL (ref <1.2)
Total Protein: 7.5 g/dL (ref 6.5–8.1)

## 2023-05-01 LAB — FERRITIN: Ferritin: 302 ng/mL (ref 24–336)

## 2023-05-01 NOTE — Progress Notes (Signed)
Hematology/Oncology Progress note [phone] [phone]      Patient Care Team: Mickel Fuchs, MD as PCP - General (Family Medicine) Rickard Patience, MD as Consulting Physician (Oncology)  ASSESSMENT & PLAN:   Alpha thalassemia minor Microcytic anemia, secondary to alpha thalassemia minor. Labs reviewed and discussed with patient and his mother. . Iron panel is normal.  No signs of iron deficiency.  We discussed about option of him follow-up with primary care provider and being discharged from our clinic.  Patient and mother agree.   Patient is discharged from my clinic.  Patient may re-establish care in the future if clinically indicated.   Rickard Patience, MD, PhD [facility] Hematology Oncology 05/01/2023   REASON FOR VISIT Follow up for treatment of microcytic anemia  HISTORY OF PRESENTING ILLNESS:  Cameron Payne is a  47 y.o.  adult with PMH listed below who was referred to me for evaluation of microcytic anemia. Patient was diagnosed with ulcerative colitis after presenting with bloody stool in June 2019.  He has establish care with [facility] Dr. Maximino Greenland.  Colonoscopy in June 2016 2019 showed suspicion examination for rectal ulcerative colitis.  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 Patient currently is on mesalamine 800 mg 3 times daily, mesalamine suppository 1000 mg at bedtime. Reports that no more additional episodes of bloody stool.  Denies any abdominal pain or black stool. 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 Iron panel was all obtained on 11/08/2017 which showed iron level 67, TIBC 339, ferritin 504.  Patient was referred to me for additional work-up for his microcytic anemia. Patient denies any nausea vomiting, abdominal pain, chest pain, shortness  of breath, weight loss.   A# 01/10/2019 colonoscopy findings erythematous mucosa in the cecum, transverse colon, ascending colon, sigmoid colon and rectum.  Biopsies were obtained.  Pathology showed chronic inflammation.  Active colitis.  INTERVAL HISTORY Cameron Payne is a 47 y.o. adult who has above history reviewed by me today presents for follow up visit for management of microcytic anemia/affect thalassemia minor  Problems and complaints are listed below: Patient is accompanied by mother. Patient has no new complaints.  He feels well, stable weight.  Patient  follows up with gastroenterology for ulcerative proctosigmoiditis   . Review of Systems  Unable to perform ROS: Other  Constitutional:  Negative for chills, fever, malaise/fatigue and weight loss.  Eyes:  Negative for double vision, photophobia and redness.  Respiratory:  Negative for cough, shortness of breath and wheezing.   Cardiovascular:  Negative for chest pain, palpitations, orthopnea and leg swelling.  Gastrointestinal:  Negative for abdominal pain, blood in stool, nausea and vomiting.  Genitourinary:  Negative for hematuria.  Musculoskeletal:  Negative for joint pain.  Neurological:  Negative for dizziness.  Endo/Heme/Allergies:  Negative for environmental allergies. Does not bruise/bleed easily.  Psychiatric/Behavioral:  Negative for hallucinations.     MEDICAL HISTORY:  Past Medical History:  Diagnosis Date   Developmental disability    Hypertension    Mild chronic ulcerative colitis without complication (HCC)    Seizures (HCC)    as a child   Syncope and collapse 07/09/2013    SURGICAL HISTORY: Past Surgical History:  Procedure Laterality Date   COLONOSCOPY WITH PROPOFOL N/A 11/22/2017   Procedure: COLONOSCOPY WITH PROPOFOL;  Surgeon: Pasty Spillers, MD;  Location: [facility] ENDOSCOPY;  Service: Endoscopy;  Laterality:  N/A;   COLONOSCOPY WITH PROPOFOL N/A 01/10/2019   Procedure: COLONOSCOPY WITH  PROPOFOL;  Surgeon: Pasty Spillers, MD;  Location: [facility] ENDOSCOPY;  Service: Endoscopy;  Laterality: N/A;   COLONOSCOPY WITH PROPOFOL N/A 07/09/2020   Procedure: COLONOSCOPY WITH PROPOFOL;  Surgeon: Pasty Spillers, MD;  Location: [facility] ENDOSCOPY;  Service: Endoscopy;  Laterality: N/A;   COLONOSCOPY WITH PROPOFOL N/A 09/13/2022   Procedure: COLONOSCOPY WITH PROPOFOL;  Surgeon: Toney Reil, MD;  Location: [facility] ENDOSCOPY;  Service: Gastroenterology;  Laterality: N/A;   ESOPHAGOGASTRODUODENOSCOPY N/A 09/13/2022   Procedure: ESOPHAGOGASTRODUODENOSCOPY (EGD);  Surgeon: Toney Reil, MD;  Location: [facility] ENDOSCOPY;  Service: Gastroenterology;  Laterality: N/A;   ESOPHAGOGASTRODUODENOSCOPY (EGD) WITH PROPOFOL N/A 11/22/2017   Procedure: ESOPHAGOGASTRODUODENOSCOPY (EGD) WITH PROPOFOL;  Surgeon: Pasty Spillers, MD;  Location: [facility] ENDOSCOPY;  Service: Endoscopy;  Laterality: N/A;   TONSILLECTOMY      SOCIAL HISTORY: Social History   Socioeconomic History   Marital status: Single    Spouse name: Not on file   Number of children: Not on file   Years of education: Not on file   Highest education level: Not on file  Occupational History   Not on file  Tobacco Use   Smoking status: Former    Types: Cigars    Quit date: 06/11/2018    Years since quitting: 4.8   Smokeless tobacco: Never   Tobacco comments:    occ.   Vaping Use   Vaping status: Never Used  Substance and Sexual Activity   Alcohol use: Not Currently    Alcohol/week: 2.0 standard drinks of alcohol    Types: 2 Cans of beer per week    Comment: occ.   Drug use: Never   Sexual activity: Yes  Other Topics Concern   Not on file  Social History Narrative   Not on file   Social Determinants of Health   Financial Resource Strain: Not on file  Food Insecurity: Not on file  Transportation Needs: Not on file  Physical Activity: Not on file  Stress: Not on file  Social Connections: Not on file   Intimate Partner Violence: Not on file    FAMILY HISTORY: Family History  Problem Relation Age of Onset   Diabetes Mother    Heart failure Mother    Cancer Father        liver   Cancer Paternal [name]        does not know what kind   Cancer Maternal Aunt        breast (half sister) pt mother was adopted   Colon cancer Neg Hx    Thyroid disease Neg Hx     ALLERGIES:  has No Known Allergies.  MEDICATIONS:  Current Outpatient Medications  Medication Sig Dispense Refill   fexofenadine (ALLEGRA) 180 MG tablet Take 180 mg by mouth daily.     folic acid (FOLVITE) 400 MCG tablet Take 1 tablet (400 mcg total) by mouth daily. 90 tablet 1   mesalamine (LIALDA) 1.2 g EC tablet TAKE 2 TABLETS(2.4 GRAMS) BY MOUTH TWICE DAILY 360 tablet 1   No current facility-administered medications for this visit.     PHYSICAL EXAMINATION: ECOG PERFORMANCE STATUS: 0 - Asymptomatic Vitals:   05/01/23 1307 05/01/23 1308  BP: (!) 147/94 (!) 150/89  Pulse: 79   Resp: 18   Temp: (!) 96.4 F (35.8 C)    Filed Weights   05/01/23 1307  Weight: 155 lb 12.8 oz (70.7 kg)    Physical  Exam Constitutional:      General: She is not in acute distress. HENT:     Head: Normocephalic and atraumatic.  Eyes:     General: No scleral icterus.    Pupils: Pupils are equal, round, and reactive to light.  Cardiovascular:     Rate and Rhythm: Normal rate.  Pulmonary:     Effort: Pulmonary effort is normal. No respiratory distress.     Breath sounds: No wheezing.  Abdominal:     General: Bowel sounds are normal. There is no distension.     Palpations: Abdomen is soft.  Musculoskeletal:        General: No deformity. Normal range of motion.     Cervical back: Normal range of motion and neck supple.  Skin:    General: Skin is warm and dry.  Neurological:     Mental Status: She is alert. Mental status is at baseline.     Cranial Nerves: No cranial nerve deficit.  Psychiatric:        Mood and Affect:  Mood normal.      LABORATORY DATA:  I have reviewed the data as listed    Latest Ref Rng & Units 05/01/2023   12:46 PM 07/11/2022   11:57 AM 04/05/2022    8:34 AM  CBC  WBC 4.0 - 10.5 K/uL 5.3  7.5  6.8   Hemoglobin 13.0 - 17.0 g/dL 29.5  62.1  30.8   Hematocrit 39.0 - 52.0 % 43.6  41.9  42.0   Platelets 150 - 400 K/uL 218  228  225       Latest Ref Rng & Units 05/01/2023   12:46 PM 07/11/2022   11:57 AM 04/05/2022    8:34 AM  CMP  Glucose 70 - 99 mg/dL 90  657  846   BUN 6 - 20 mg/dL 16  13  14    Creatinine 0.61 - 1.24 mg/dL 9.62  9.52  8.41   Sodium 135 - 145 mmol/L 142  140  138   Potassium 3.5 - 5.1 mmol/L 3.8  3.5  3.6   Chloride 98 - 111 mmol/L 106  104  106   CO2 22 - 32 mmol/L 26  27  25    Calcium 8.9 - 10.3 mg/dL 9.0  9.0  9.0   Total Protein 6.5 - 8.1 g/dL 7.5   7.7   Total Bilirubin <1.2 mg/dL 0.5   0.4   Alkaline Phos 38 - 126 U/L 43   48   AST 15 - 41 U/L 16   21   ALT 0 - 44 U/L 12   16    Lab Results  Component Value Date   IRON 92 05/01/2023   TIBC 301 05/01/2023   FERRITIN 302 05/01/2023

## 2023-05-01 NOTE — Assessment & Plan Note (Addendum)
Microcytic anemia, secondary to alpha thalassemia minor. Labs reviewed and discussed with patient and his mother. . Iron panel is normal.  No signs of iron deficiency.  We discussed about option of him follow-up with primary care provider and being discharged from our clinic.  Patient and mother agree.

## 2023-05-08 ENCOUNTER — Ambulatory Visit: Payer: 59 | Admitting: Gastroenterology

## 2023-06-30 ENCOUNTER — Other Ambulatory Visit: Payer: Self-pay

## 2023-08-02 ENCOUNTER — Ambulatory Visit (INDEPENDENT_AMBULATORY_CARE_PROVIDER_SITE_OTHER): Payer: 59 | Admitting: Gastroenterology

## 2023-08-02 ENCOUNTER — Other Ambulatory Visit: Payer: Self-pay

## 2023-08-02 ENCOUNTER — Encounter: Payer: Self-pay | Admitting: Gastroenterology

## 2023-08-02 VITALS — BP 150/98 | HR 76 | Temp 97.7°F | Ht 71.0 in | Wt 159.2 lb

## 2023-08-02 DIAGNOSIS — D563 Thalassemia minor: Secondary | ICD-10-CM | POA: Diagnosis not present

## 2023-08-02 DIAGNOSIS — K5130 Ulcerative (chronic) rectosigmoiditis without complications: Secondary | ICD-10-CM | POA: Diagnosis not present

## 2023-08-02 NOTE — Progress Notes (Signed)
 [name], MD [address]  [address]  [place], [place] [address]  Main: [phone]  Fax: [phone]    Gastroenterology Consultation  Referring Provider:     Mickel Fuchs, MD Primary Care Physician:  Mickel Fuchs, MD Primary Gastroenterologist:  Dr. [name] Reason for Consultation:     Ulcerative colitis        HPI:   Cameron Payne is a 48 y.o. adult referred by Dr. Mickel Fuchs, MD  for consultation & management of ulcerative colitis.   Follow-up visit 02/06/2023 Cameron Payne is here for follow-up of ulcerative colitis.  He reports doing well on higher dose of mesalamine 2.4 g twice daily.  He is not on Rowasa enema.  Patient is accompanied by his mom today.  Patient denies any rectal bleeding, diarrhea, abdominal pain or bloating.  Patient does not have any concerns today.  His fecal calprotectin levels 6 months ago were normal   IBD diagnosis: Diagnosed with ulcerative pancolitis based on the index colonoscopy on 11/22/2017  Disease course: Clinical presentation at the time of diagnosis include rectal bleeding, loose stools mixed with blood, weight loss in May 2019, underwent upper endoscopy as well as colonoscopy.  EGD was normal.  Colonoscopy revealed mild erythema in the cecum and rectum, pathology revealed mild active colitis in the cecum, moderate active colitis in the rectum with crypt abscess.  Patient was started on Canasa suppository as well as oral mesalamine since diagnosis.  Fecal calprotectin levels at the time of diagnosis were 126, peaked at 483 in 6/21.  Follow-up colonoscopy revealed mild chronic active colitis in the entire colon.  Patient has been maintained on Lialda 2.4 g daily.  Subsequently, fecal calprotectin levels have normalized in 10/21, levels increased to 135 in 03/2021 followed by being normal in 3/23.  Colonoscopy in 2/22 revealed quiescent colitis.  Fecal calprotectin levels were elevated to 572 in 06/2022 compared to  completely normal in 07/2021.  GI profile PCR negative for infection.  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  Therefore, I have started him on Rowasa enema and increase Lialda to 2.4 g twice daily.  However, patient has been taking Lialda only once daily.  He finished 2 weeks course of Rowasa enema and reports that his stools are less frequent, less amount as well as softer.  He denies any rectal bleeding.  His weight has been stable.  Patient is accompanied by his mom today.  Extra intestinal manifestations: None  IBD surgical history: None  Imaging:  MRE none CTE none SBFT none  Procedures: Upper endoscopy and colonoscopy 09/13/2022 DIAGNOSIS:  A. ESOPHAGUS; COLD BIOPSY:  - SQUAMOUS MUCOSA WITH PATCHY NON-SPECIFIC SPONGIOSIS.  - NEGATIVE FOR INTRAEPITHELIAL EOSINOPHILS, DYSPLASIA, AND MALIGNANCY.   B.  COLON, RIGHT; COLD BIOPSY:  - UNREMARKABLE COLONIC MUCOSA.  - NEGATIVE FOR ACTIVE COLITIS, GRANULOMAS, DYSPLASIA, AND MALIGNANCY.   C.  COLON, LEFT; COLD BIOPSY:  - UNREMARKABLE COLONIC MUCOSA.  - NEGATIVE FOR ACTIVE COLITIS, GRANULOMAS, DYSPLASIA, AND MALIGNANCY.   D.  RECTUM AND RECTOSIGMOID; COLD BIOPSY:  - MODERATE CHRONIC ACTIVE PROCTOCOLITIS.  - NEGATIVE FOR GRANULOMAS, DYSPLASIA, AND MALIGNANCY.    Colonoscopy 11/22/2017 - The examination was suspicious for ulcerative proctitis - The examined portion of the ileum was normal. Biopsied. - Erythematous mucosa at the appendiceal orifice. Biopsied. - Patchy mild mucosal changes were found in the rectum, rule out ulcerative colitis. Biopsied. - The sigmoid colon,  descending colon, transverse colon, ascending colon and cecum are normal. Biopsied. - The distal rectum and anal verge are normal on retroflexion view.  DIAGNOSIS:   B.  TERMINAL ILEUM; COLD BIOPSY:  - SMALL BOWEL MUCOSA WITH INTACT VILLOUS ARCHITECTURE.  - NEGATIVE FOR  INTRAEPITHELIAL LYMPHOCYTOSIS, DYSPLASIA AND MALIGNANCY.   C.  COLON, APPENDICEAL ORIFICE; COLD BIOPSY:  - MILD ACTIVE COLITIS.  - NEGATIVE FOR DYSPLASIA AND MALIGNANCY.   D.  COLON, CECUM AND ASCENDING; COLD BIOPSY:  - MUCOSAL EDEMA AND PROMINENT LYMPHOID AGGREGATES.  - NEGATIVE FOR DYSPLASIA AND MALIGNANCY.   E.  TRANSVERSE COLON; COLD BIOPSY:  - MUCOSAL HEMORRHAGE AND PROMINENT LYMPHOID AGGREGATES.  - NEGATIVE FOR DYSPLASIA AND MALIGNANCY.   F.  COLON, DESCENDING AND SIGMOID; COLD BIOPSY:  - MUCOSAL HEMORRHAGE.  - NEGATIVE FOR DYSPLASIA AND MALIGNANCY.   G.  RECTUM; COLD BIOPSY:  - MODERATE ACTIVE COLITIS/PROCTITIS WITH ARCHITECTURAL FEATURES OF  CHRONICITY.  - NEGATIVE FOR DYSPLASIA AND MALIGNANCY.   Colonoscopy 01/10/2019 - Erythematous mucosa in the cecum. Biopsied. - Erythematous mucosa in the transverse colon and in the ascending colon. Biopsied. - Erythematous mucosa in the sigmoid colon. Biopsied. - Erythematous mucosa in the rectum. Biopsied. - The distal rectum and anal verge are normal on retroflexion view. - Biopsies were obtained in the descending colon, in the transverse colon and in the ascending colon. - The areas of erythema were in the periappendiceal region of the cecum, very patchy erythema in the ascending, transverse and sigmoid colon. Mild pathcy erythema in the rectum. Rest of the colon was normal. DIAGNOSIS:  A.  CECUM ERYTHEMA; COLD BIOPSY:  - MILD TO MODERATE CHRONIC ACTIVE COLITIS IN 2 OF 3 FRAGMENTS.   B.  ASCENDING, TRANSVERSE, AND DESCENDING COLON; COLD BIOPSY:  - MILD CHRONIC ACTIVE COLITIS INVOLVING 1 OF 10 FRAGMENTS.   C.  SIGMOID COLON ERYTHEMA; COLD BIOPSY:  - MILD CHRONIC ACTIVE COLITIS IN 2 OF 6 FRAGMENTS.   D.  RECTUM ERYTHEMA; COLD BIOPSY:  - MILD CHRONIC ACTIVE PROCTITIS IN 2 OF 2 FRAGMENTS.   Colonoscopy 07/09/2020 - One 4 mm polyp in the descending colon, removed with a cold biopsy forceps. Resected and retrieved. - The  examination was otherwise normal. - The rectum, sigmoid colon, descending colon, transverse colon, ascending colon, cecum and terminal ileum are normal. Biopsied. - The distal rectum and anal verge are normal on retroflexion view. DIAGNOSIS:  A.  TERMINAL ILEUM; COLD BIOPSY:  - ILEAL MUCOSA WITH INTACT VILLI.  - NEGATIVE FOR ACTIVE INFLAMMATION, INTRAEPITHELIAL LYMPHOCYTOSIS, AND  INFECTIOUS AGENTS.   B.  CECUM; COLD BIOPSY:  - COLONIC MUCOSA WITH INTACT CRYPT ARCHITECTURE.  - NO EVIDENCE OF COLITIS   C.  ASCENDING COLON; COLD BIOPSY:  - COLONIC MUCOSA WITH INTACT CRYPT ARCHITECTURE.  - NO EVIDENCE OF COLITIS.   D.  TRANSVERSE COLON; COLD BIOPSY:  - ONE OF 2 FRAGMENTS WITH FEATURES OF CRYPT DROPOUT.  - NO EVIDENCE OF ACTIVE INFLAMMATION   E.  DESCENDING COLON; COLD BIOPSY:  - TWO FRAGMENTS WITH FEATURES OF CRYPT DROPOUT.  - NO EVIDENCE OF ACTIVE INFLAMMATION.   F.  COLON POLYP, DESCENDING; COLD BIOPSY:  - TUBULAR ADENOMA.  - NEGATIVE FOR HIGH-GRADE DYSPLASIA AND MALIGNANCY.   G.  SIGMOID COLON; COLD BIOPSY:  - MILDLY ALTERED CRYPT ARCHITECTURE AND MILD INFILTRATES OF PLASMA CELLS  AND EOSINOPHILS EXTENDING TO THE CRYPT BASES, SEE COMMENT.  - NO EVIDENCE OF ACTIVE INFLAMMATION.   H.  RECTUM; COLD BIOPSY:  - MILDLY ALTERED  CRYPT ARCHITECTURE WITHOUT SIGNIFICANT CHRONIC  INFLAMMATION.  - NO EVIDENCE OF ACTIVE INFLAMMATION.   Upper Endoscopy 11/22/2017 - White nummular lesions in esophageal mucosa. Brushings performed. - Normal stomach. - Normal duodenal bulb, second portion of the duodenum and examined duodenum. Biopsied. A.  DUODENUM, SECOND PORTION END BULB; COLD BIOPSY:  - DUODENAL MUCOSA WITH PRESERVED VILLOUS ARCHITECTURE AND FOCAL  BRUNNER'S GLAND HYPERPLASIA.  - NEGATIVE FOR INTRA-EPITHELIAL LYMPHOCYTOSIS, DYSPLASIA AND MALIGNANCY.   VCE none  IBD medications:  Steroids: None  5-ASA: Lialda 2.4 g daily since 10/2017, Canasa suppository at bedtime since 10/2017,  at the time of diagnosis, stopped in 3/23 Immunomodulators: AZA, methotrexate: None TPMT status unknown Biologics: Nave Anti TNFs: Anti Integrins: Ustekinumab: Tofactinib: Clinical trial:  NSAIDs: None  Antiplts/Anticoagulants/Anti thrombotics: None  Past Medical History:  Diagnosis Date   Developmental disability    Hypertension    Mild chronic ulcerative colitis without complication (HCC)    Seizures (HCC)    as a child   Syncope and collapse 07/09/2013    Past Surgical History:  Procedure Laterality Date   COLONOSCOPY WITH PROPOFOL N/A 11/22/2017   Procedure: COLONOSCOPY WITH PROPOFOL;  Surgeon: Pasty Spillers, MD;  Location: [facility] ENDOSCOPY;  Service: Endoscopy;  Laterality: N/A;   COLONOSCOPY WITH PROPOFOL N/A 01/10/2019   Procedure: COLONOSCOPY WITH PROPOFOL;  Surgeon: Pasty Spillers, MD;  Location: [facility] ENDOSCOPY;  Service: Endoscopy;  Laterality: N/A;   COLONOSCOPY WITH PROPOFOL N/A 07/09/2020   Procedure: COLONOSCOPY WITH PROPOFOL;  Surgeon: Pasty Spillers, MD;  Location: [facility] ENDOSCOPY;  Service: Endoscopy;  Laterality: N/A;   COLONOSCOPY WITH PROPOFOL N/A 09/13/2022   Procedure: COLONOSCOPY WITH PROPOFOL;  Surgeon: Toney Reil, MD;  Location: [facility] ENDOSCOPY;  Service: Gastroenterology;  Laterality: N/A;   ESOPHAGOGASTRODUODENOSCOPY N/A 09/13/2022   Procedure: ESOPHAGOGASTRODUODENOSCOPY (EGD);  Surgeon: Toney Reil, MD;  Location: [facility] ENDOSCOPY;  Service: Gastroenterology;  Laterality: N/A;   ESOPHAGOGASTRODUODENOSCOPY (EGD) WITH PROPOFOL N/A 11/22/2017   Procedure: ESOPHAGOGASTRODUODENOSCOPY (EGD) WITH PROPOFOL;  Surgeon: Pasty Spillers, MD;  Location: [facility] ENDOSCOPY;  Service: Endoscopy;  Laterality: N/A;   TONSILLECTOMY     Current Outpatient Medications:    fexofenadine (ALLEGRA) 180 MG tablet, Take 180 mg by mouth daily., Disp: , Rfl:    folic acid (FOLVITE) 400 MCG tablet, Take 1 tablet (400 mcg total) by mouth daily.,  Disp: 90 tablet, Rfl: 1   mesalamine (LIALDA) 1.2 g EC tablet, TAKE 2 TABLETS(2.4 GRAMS) BY MOUTH TWICE DAILY, Disp: 360 tablet, Rfl: 1  Family History  Problem Relation Age of Onset   Diabetes Mother    Heart failure Mother    Cancer Father        liver   Cancer Paternal [name]        does not know what kind   Cancer Maternal Aunt        breast (half sister) pt mother was adopted   Colon cancer Neg Hx    Thyroid disease Neg Hx      Social History   Tobacco Use   Smoking status: Former    Types: Cigars    Quit date: 06/11/2018    Years since quitting: 5.1   Smokeless tobacco: Never   Tobacco comments:    occ.   Vaping Use   Vaping status: Never Used  Substance Use Topics   Alcohol use: Not Currently    Alcohol/week: 2.0 standard drinks of alcohol    Types: 2 Cans of beer per week  Comment: occ.   Drug use: Never    Allergies as of 08/02/2023   (No Known Allergies)    Review of Systems:    All systems reviewed and negative except where noted in HPI.   Physical Exam:  BP (!) 150/98 (BP Location: Right Arm, Patient Position: Sitting, Cuff Size: Normal)   Pulse 76   Temp 97.7 F (36.5 C) (Oral)   Ht 5\' 11"  (1.803 m)   Wt 159 lb 4 oz (72.2 kg)   BMI 22.21 kg/m  No LMP recorded.  General:   Alert,  Well-developed, well-nourished, pleasant and cooperative in NAD Head:  Normocephalic and atraumatic. Eyes:  Sclera clear, no icterus.   Conjunctiva pink. Ears:  Normal auditory acuity. Nose:  No deformity, discharge, or lesions. Mouth:  No deformity or lesions,oropharynx pink & moist. Neck:  Supple; no masses or thyromegaly. Lungs:  Respirations even and unlabored.  Clear throughout to auscultation.   No wheezes, crackles, or rhonchi. No acute distress. Heart:  Regular rate and rhythm; no murmurs, clicks, rubs, or gallops. Abdomen:  Normal bowel sounds. Soft, non-tender and non-distended without masses, hepatosplenomegaly or hernias noted.  No guarding or  rebound tenderness.   Rectal: Not performed Msk:  Symmetrical without gross deformities. Good, equal movement & strength bilaterally. Pulses:  Normal pulses noted. Extremities:  No clubbing or edema.  No cyanosis. Neurologic:  Alert and oriented x3;  grossly normal neurologically. Skin:  Intact without significant lesions or rashes. No jaundice. Psych:  Alert and cooperative. Normal mood and affect.  Assessment and Plan:   GRAE CANNATA is a 48 y.o. 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

## 2023-08-03 ENCOUNTER — Encounter: Payer: Self-pay | Admitting: Gastroenterology

## 2023-08-08 ENCOUNTER — Telehealth: Payer: Self-pay

## 2023-08-08 NOTE — Telephone Encounter (Signed)
 Patient mother called and left a voicemail on my phone wanting to know if the patient needed to be on clear liquids all day tomorrow. Called her back and informed her no only at dinner time he needed to have clear liquids. He can have breakfast and lunch tomorrow. She verbalized understanding

## 2023-08-10 ENCOUNTER — Encounter: Payer: Self-pay | Admitting: Gastroenterology

## 2023-08-10 ENCOUNTER — Ambulatory Visit: Admitting: Anesthesiology

## 2023-08-10 ENCOUNTER — Ambulatory Visit
Admission: RE | Admit: 2023-08-10 | Discharge: 2023-08-10 | Disposition: A | Discharge status: Home / Self Care | Attending: Gastroenterology | Admitting: Gastroenterology

## 2023-08-10 ENCOUNTER — Encounter
Admission: RE | Disposition: A | Discharge status: Home / Self Care | Payer: Self-pay | Source: Home / Self Care | Attending: Gastroenterology

## 2023-08-10 DIAGNOSIS — Z8669 Personal history of other diseases of the nervous system and sense organs: Secondary | ICD-10-CM | POA: Diagnosis not present

## 2023-08-10 DIAGNOSIS — Z87891 Personal history of nicotine dependence: Secondary | ICD-10-CM | POA: Insufficient documentation

## 2023-08-10 DIAGNOSIS — K5150 Left sided colitis without complications: Secondary | ICD-10-CM

## 2023-08-10 DIAGNOSIS — K5130 Ulcerative (chronic) rectosigmoiditis without complications: Secondary | ICD-10-CM | POA: Diagnosis present

## 2023-08-10 DIAGNOSIS — Z8711 Personal history of peptic ulcer disease: Secondary | ICD-10-CM | POA: Diagnosis not present

## 2023-08-10 DIAGNOSIS — I10 Essential (primary) hypertension: Secondary | ICD-10-CM | POA: Diagnosis not present

## 2023-08-10 HISTORY — PX: FLEXIBLE SIGMOIDOSCOPY: SHX5431

## 2023-08-10 SURGERY — SIGMOIDOSCOPY, FLEXIBLE
Anesthesia: General

## 2023-08-10 MED ORDER — SODIUM CHLORIDE 0.9 % IV SOLN: INTRAVENOUS | Status: DC

## 2023-08-10 MED ORDER — PROPOFOL 500 MG/50ML IV EMUL
INTRAVENOUS | Status: DC | PRN
  Administered 2023-08-10: 100 ug/kg/min via INTRAVENOUS

## 2023-08-10 MED ORDER — PROPOFOL 10 MG/ML IV BOLUS
INTRAVENOUS | Status: DC | PRN
  Administered 2023-08-10: 70 mg via INTRAVENOUS

## 2023-08-10 MED ORDER — LIDOCAINE HCL (CARDIAC) PF 100 MG/5ML IV SOSY
PREFILLED_SYRINGE | INTRAVENOUS | Status: DC | PRN
  Administered 2023-08-10: 50 mg via INTRAVENOUS

## 2023-08-10 NOTE — Anesthesia Preprocedure Evaluation (Signed)
Anesthesia Evaluation  Patient identified by MRN, date of birth, ID band Patient awake    Reviewed: Allergy & Precautions, H&P , NPO status , Patient's Chart, lab work & pertinent test results, reviewed documented beta blocker date and time   History of Anesthesia Complications Negative for: history of anesthetic complications  Airway Mallampati: III   Neck ROM: full    Dental  (+) Teeth Intact, Dental Advidsory Given   Pulmonary neg pulmonary ROS, former smoker   Pulmonary exam normal        Cardiovascular Exercise Tolerance: Good hypertension, On Medications (-) angina (-) Past MI and (-) Cardiac Stents Normal cardiovascular exam(-) dysrhythmias (-) Valvular Problems/Murmurs Rhythm:regular Rate:Normal     Neuro/Psych Seizures - (as a child, none in years), Well Controlled,   negative psych ROS   GI/Hepatic Neg liver ROS, PUD,,,  Endo/Other  negative endocrine ROS    Renal/GU negative Renal ROS  negative genitourinary   Musculoskeletal   Abdominal   Peds  Hematology  (+) Blood dyscrasia, anemia   Anesthesia Other Findings Past Medical History: No date: Developmental disability No date: Hypertension No date: Seizures (HCC)     Comment:  as a child Past Surgical History: 11/22/2017: COLONOSCOPY WITH PROPOFOL; N/A     Comment:  Procedure: COLONOSCOPY WITH PROPOFOL;  Surgeon:               Pasty Spillers, MD;  Location: [facility] ENDOSCOPY;                Service: Endoscopy;  Laterality: N/A; 01/10/2019: COLONOSCOPY WITH PROPOFOL; N/A     Comment:  Procedure: COLONOSCOPY WITH PROPOFOL;  Surgeon:               Pasty Spillers, MD;  Location: [facility] ENDOSCOPY;                Service: Endoscopy;  Laterality: N/A; 11/22/2017: ESOPHAGOGASTRODUODENOSCOPY (EGD) WITH PROPOFOL; N/A     Comment:  Procedure: ESOPHAGOGASTRODUODENOSCOPY (EGD) WITH               PROPOFOL;  Surgeon: Pasty Spillers, MD;  Location:                [facility] ENDOSCOPY;  Service: Endoscopy;  Laterality: N/A; BMI    Body Mass Index: 22.08 kg/m     Reproductive/Obstetrics negative OB ROS                              Anesthesia Physical Anesthesia Plan  ASA: 2  Anesthesia Plan: General   Post-op Pain Management:    Induction: Intravenous  PONV Risk Score and Plan: 2 and Propofol infusion and TIVA  Airway Management Planned: Natural Airway and Nasal Cannula  Additional Equipment:   Intra-op Plan:   Post-operative Plan:   Informed Consent: I have reviewed the patients History and Physical, chart, labs and discussed the procedure including the risks, benefits and alternatives for the proposed anesthesia with the patient or authorized representative who has indicated his/her understanding and acceptance.     Dental Advisory Given  Plan Discussed with: CRNA  Anesthesia Plan Comments:          Anesthesia Quick Evaluation

## 2023-08-10 NOTE — Anesthesia Postprocedure Evaluation (Signed)
 Anesthesia Post Note  Patient: Cameron Payne  Procedure(s) Performed: Arnell Sieving  Patient location during evaluation: PACU Anesthesia Type: General Level of consciousness: awake and alert Pain management: pain level controlled Vital Signs Assessment: post-procedure vital signs reviewed and stable Respiratory status: spontaneous breathing, nonlabored ventilation, respiratory function stable and patient connected to nasal cannula oxygen Cardiovascular status: blood pressure returned to baseline and stable Postop Assessment: no apparent nausea or vomiting Anesthetic complications: no   No notable events documented.   Last Vitals:  Vitals:   08/10/23 1334 08/10/23 1505  BP: (!) 142/89 118/74  Pulse: 70 79  Resp: 16 (!) 23  Temp: (!) 36.1 C (!) 36.4 C  SpO2: 100% 100%    Last Pain:  Vitals:   08/10/23 1505  TempSrc: Temporal  PainSc: 0-No pain                 Corinda Gubler

## 2023-08-10 NOTE — H&P (Signed)
 Cameron Repress, MD [address]  [address]  [place], [place] [address]  Main: [phone]  Fax: [phone] Pager: [phone]  Primary Care Physician:  Mickel Fuchs, MD Primary Gastroenterologist:  Dr. Arlyss Payne  Pre-Procedure History & Physical: HPI:  Cameron Payne is a 48 y.o. adult is here for an flexible sigmoidoscopy.   Past Medical History:  Diagnosis Date   Developmental disability    Hypertension    Mild chronic ulcerative colitis without complication (HCC)    Seizures (HCC)    as a child   Syncope and collapse 07/09/2013    Past Surgical History:  Procedure Laterality Date   COLONOSCOPY WITH PROPOFOL N/A 11/22/2017   Procedure: COLONOSCOPY WITH PROPOFOL;  Surgeon: Cameron Spillers, MD;  Location: [facility] ENDOSCOPY;  Service: Endoscopy;  Laterality: N/A;   COLONOSCOPY WITH PROPOFOL N/A 01/10/2019   Procedure: COLONOSCOPY WITH PROPOFOL;  Surgeon: Cameron Spillers, MD;  Location: [facility] ENDOSCOPY;  Service: Endoscopy;  Laterality: N/A;   COLONOSCOPY WITH PROPOFOL N/A 07/09/2020   Procedure: COLONOSCOPY WITH PROPOFOL;  Surgeon: Cameron Spillers, MD;  Location: [facility] ENDOSCOPY;  Service: Endoscopy;  Laterality: N/A;   COLONOSCOPY WITH PROPOFOL N/A 09/13/2022   Procedure: COLONOSCOPY WITH PROPOFOL;  Surgeon: Cameron Reil, MD;  Location: [facility] ENDOSCOPY;  Service: Gastroenterology;  Laterality: N/A;   ESOPHAGOGASTRODUODENOSCOPY N/A 09/13/2022   Procedure: ESOPHAGOGASTRODUODENOSCOPY (EGD);  Surgeon: Cameron Reil, MD;  Location: [facility] ENDOSCOPY;  Service: Gastroenterology;  Laterality: N/A;   ESOPHAGOGASTRODUODENOSCOPY (EGD) WITH PROPOFOL N/A 11/22/2017   Procedure: ESOPHAGOGASTRODUODENOSCOPY (EGD) WITH PROPOFOL;  Surgeon: Cameron Spillers, MD;  Location: [facility] ENDOSCOPY;  Service: Endoscopy;  Laterality: N/A;   TONSILLECTOMY      Prior to Admission medications   Medication Sig Start Date End Date Taking? Authorizing Provider   fexofenadine (ALLEGRA) 180 MG tablet Take 180 mg by mouth daily.   Yes [provider]  folic acid (FOLVITE) 400 MCG tablet Take 1 tablet (400 mcg total) by mouth daily. 03/12/18  Yes Rickard Patience, MD  mesalamine (LIALDA) 1.2 g EC tablet TAKE 2 TABLETS(2.4 GRAMS) BY MOUTH TWICE DAILY 03/07/23  Yes Cameron Reil, MD    Allergies as of 08/02/2023   (No Known Allergies)    Family History  Problem Relation Age of Onset   Diabetes Mother    Heart failure Mother    Cancer Father        liver   Cancer Paternal Grandfather        does not know what kind   Cancer Maternal Aunt        breast (half sister) pt mother was adopted   Colon cancer Neg Hx    Thyroid disease Neg Hx     Social History   Socioeconomic History   Marital status: Single    Spouse name: Not on file   Number of children: Not on file   Years of education: Not on file   Highest education level: Not on file  Occupational History   Not on file  Tobacco Use   Smoking status: Former    Types: Cigars    Quit date: 06/11/2018    Years since quitting: 5.1   Smokeless tobacco: Never   Tobacco comments:    occ.   Vaping Use   Vaping status: Never Used  Substance and Sexual Activity   Alcohol use: Not Currently    Alcohol/week: 2.0 standard drinks of alcohol    Types: 2 Cans of beer per  week    Comment: occ.   Drug use: Never   Sexual activity: Yes  Other Topics Concern   Not on file  Social History Narrative   Not on file   Social Drivers of Health   Financial Resource Strain: Not on file  Food Insecurity: Not on file  Transportation Needs: Not on file  Physical Activity: Not on file  Stress: Not on file  Social Connections: Not on file  Intimate Partner Violence: Not on file    Review of Systems: See HPI, otherwise negative ROS  Physical Exam: BP (!) 142/89   Pulse 70   Temp (!) 97 F (36.1 C) (Temporal)   Resp 16   Wt 69.4 kg   SpO2 100%   BMI 21.34 kg/m  General:   Alert,   pleasant and cooperative in NAD Head:  Normocephalic and atraumatic. Neck:  Supple; no masses or thyromegaly. Lungs:  Clear throughout to auscultation.    Heart:  Regular rate and rhythm. Abdomen:  Soft, nontender and nondistended. Normal bowel sounds, without guarding, and without rebound.   Neurologic:  Alert and  oriented x4;  grossly normal neurologically.  Impression/Plan: Cameron Payne is here for an flexible sigmoidoscopy to be performed for ulcerative colitis  Risks, benefits, limitations, and alternatives regarding  flexible sigmoidoscopy have been reviewed with the patient.  Questions have been answered.  All parties agreeable.   [name], MD  08/10/2023, 1:45 PM

## 2023-08-10 NOTE — Op Note (Signed)
 [facility] Gastroenterology Patient Name: Cameron Payne Procedure Date: 08/10/2023 2:41 PM MRN: [id number] Account #: [id number] Date of Birth: Aug 15, 1975 Admit Type: Outpatient Age: 48 Room: [facility] ENDO ROOM 3 Gender: Male Note Status: Finalized Instrument Name: Upper Endoscope [id number] Procedure:             Flexible Sigmoidoscopy Indications:           Disease activity assessment of left-sided chronic                         ulcerative colitis, Assess therapeutic response to                         therapy of left-sided chronic ulcerative colitis Providers:             Toney Reil MD, MD Referring MD:          Ezekiel Slocumb, MD (Referring MD) Medicines:             General Anesthesia Complications:         No immediate complications. Estimated blood loss: None. Procedure:             Pre-Anesthesia Assessment:                        - Prior to the procedure, a History and Physical was                         performed, and patient medications and allergies were                         reviewed. The patient is competent. The risks and                         benefits of the procedure and the sedation options and                         risks were discussed with the patient. All questions                         were answered and informed consent was obtained.                         Patient identification and proposed procedure were                         verified by the physician, the nurse, the                         anesthesiologist, the anesthetist and the technician                         in the pre-procedure area in the procedure room in the                         endoscopy suite. Mental Status Examination: alert and                         oriented. Airway Examination: normal oropharyngeal  airway and neck mobility. Respiratory Examination:                         clear to auscultation. CV Examination: normal.                          Prophylactic Antibiotics: The patient does not require                         prophylactic antibiotics. Prior Anticoagulants: The                         patient has taken no anticoagulant or antiplatelet                         agents. ASA Grade Assessment: II - A patient with mild                         systemic disease. After reviewing the risks and                         benefits, the patient was deemed in satisfactory                         condition to undergo the procedure. The anesthesia                         plan was to use general anesthesia. Immediately prior                         to administration of medications, the patient was                         re-assessed for adequacy to receive sedatives. The                         heart rate, respiratory rate, oxygen saturations,                         blood pressure, adequacy of pulmonary ventilation, and                         response to care were monitored throughout the                         procedure. The physical status of the patient was                         re-assessed after the procedure.                        After obtaining informed consent, the scope was passed                         under direct vision. The Endosonoscope was introduced                         through the anus and advanced to the the descending  colon. The flexible sigmoidoscopy was accomplished                         without difficulty. The patient tolerated the                         procedure well. The quality of the bowel preparation                         was good. Findings:      The perianal and digital rectal examinations were normal. Pertinent       negatives include normal sphincter tone and no palpable rectal lesions.      The entire examined colon appeared normal. Biopsies were taken with a       cold forceps for histology. Impression:            - The entire examined colon is normal.  Biopsied. Recommendation:        - Discharge patient to home (with parent).                        - Await pathology results.                        - Continue present medications.                        - Return to my office as previously scheduled. Procedure Code(s):     --- Professional ---                        [phone], Sigmoidoscopy, flexible; with biopsy, single or                         multiple CPT copyright 2022 American [employer]. All rights reserved. The codes documented in this report are preliminary and upon coder review may  be revised to meet current compliance requirements. Dr. Libby Maw Toney Reil MD, MD 08/10/2023 3:02:31 PM This report has been signed electronically. Number of Addenda: 0 Note Initiated On: 08/10/2023 2:41 PM Total Procedure Duration: 0 hours 5 minutes 16 seconds  Estimated Blood Loss:  Estimated blood loss: none.      [facility]

## 2023-08-10 NOTE — Anesthesia Procedure Notes (Signed)
 Procedure Name: MAC Date/Time: 08/10/2023 2:50 PM  Performed by: Elmarie Mainland, CRNAPre-anesthesia Checklist: Patient identified, Emergency Drugs available, Suction available and Patient being monitored Patient Re-evaluated:Patient Re-evaluated prior to induction Oxygen Delivery Method: Nasal cannula

## 2023-08-10 NOTE — Transfer of Care (Signed)
 Immediate Anesthesia Transfer of Care Note  Patient: Cameron Payne  Procedure(s) Performed: Arnell Sieving  Patient Location: PACU and Endoscopy Unit  Anesthesia Type:General  Level of Consciousness: awake, drowsy, and patient cooperative  Airway & Oxygen Therapy: Patient Spontanous Breathing  Post-op Assessment: Report given to RN and Post -op Vital signs reviewed and stable  Post vital signs: Reviewed and stable  Last Vitals:  Vitals Value Taken Time  BP 118/74 08/10/23 1505  Temp 36.4 C 08/10/23 1505  Pulse 73 08/10/23 1508  Resp 25 08/10/23 1509  SpO2 100 % 08/10/23 1508  Vitals shown include unfiled device data.  Last Pain:  Vitals:   08/10/23 1505  TempSrc: Temporal  PainSc: 0-No pain         Complications: No notable events documented.

## 2023-08-11 ENCOUNTER — Encounter: Payer: Self-pay | Admitting: Gastroenterology

## 2023-08-14 LAB — SURGICAL PATHOLOGY

## 2023-08-15 ENCOUNTER — Telehealth: Payer: Self-pay

## 2023-08-15 NOTE — Telephone Encounter (Signed)
 Called patient mother wanda and patient verbalized understanding of results

## 2023-08-15 NOTE — Telephone Encounter (Signed)
-----   Message from [facility] sent at 08/15/2023 11:38 AM EDT ----- Please inform patient's mom that the pathology results from sigmoidoscopy came back normal and his colitis is in remission.  Continue current dose of mesalamine  RV

## 2023-10-25 ENCOUNTER — Telehealth: Payer: Self-pay | Admitting: Gastroenterology

## 2023-10-25 NOTE — Telephone Encounter (Signed)
 Wallene Gum, the mother of the patient, called to inquire about her son's appointment date and time. I informed her that her son does not have an appointment with Dr. Baldomero Bone, as Dr. Baldomero Bone is no longer seeing patients in the office and will be leaving at the end of the month to join another practice. Wallene Gum was understanding and expressed her gratitude for the information.
